# Patient Record
Sex: Female | Born: 1975 | Race: White | Hispanic: No | Smoking: Current every day smoker
Health system: Southern US, Community
[De-identification: ages and names within clinical notes are randomized; demographics above are authoritative.]

## PROBLEM LIST (undated history)

## (undated) DIAGNOSIS — I10 Essential (primary) hypertension: Secondary | ICD-10-CM

## (undated) DIAGNOSIS — F319 Bipolar disorder, unspecified: Secondary | ICD-10-CM

---

## 2001-02-18 ENCOUNTER — Other Ambulatory Visit: Admission: RE | Admit: 2001-02-18 | Discharge: 2001-02-18 | Payer: Self-pay

## 2002-12-15 ENCOUNTER — Other Ambulatory Visit: Admission: RE | Admit: 2002-12-15 | Discharge: 2002-12-15 | Payer: Self-pay

## 2004-06-04 HISTORY — PX: LEEP: SHX91

## 2005-03-15 ENCOUNTER — Other Ambulatory Visit: Admission: RE | Admit: 2005-03-15 | Discharge: 2005-03-15 | Payer: Self-pay | Admitting: Gynecology

## 2007-02-11 ENCOUNTER — Other Ambulatory Visit: Admission: RE | Admit: 2007-02-11 | Discharge: 2007-02-11 | Payer: Self-pay | Admitting: Obstetrics and Gynecology

## 2010-06-25 ENCOUNTER — Encounter: Payer: Self-pay | Admitting: Obstetrics and Gynecology

## 2014-09-02 ENCOUNTER — Emergency Department (HOSPITAL_COMMUNITY): Payer: 59

## 2014-09-02 ENCOUNTER — Encounter (HOSPITAL_COMMUNITY): Payer: Self-pay | Admitting: Emergency Medicine

## 2014-09-02 ENCOUNTER — Emergency Department (HOSPITAL_COMMUNITY)
Admission: EM | Admit: 2014-09-02 | Discharge: 2014-09-02 | Disposition: A | Discharge status: Home / Self Care | Payer: 59 | Attending: Emergency Medicine | Admitting: Emergency Medicine

## 2014-09-02 DIAGNOSIS — R197 Diarrhea, unspecified: Secondary | ICD-10-CM | POA: Insufficient documentation

## 2014-09-02 DIAGNOSIS — F319 Bipolar disorder, unspecified: Secondary | ICD-10-CM | POA: Insufficient documentation

## 2014-09-02 DIAGNOSIS — R110 Nausea: Secondary | ICD-10-CM | POA: Insufficient documentation

## 2014-09-02 DIAGNOSIS — Z79899 Other long term (current) drug therapy: Secondary | ICD-10-CM | POA: Insufficient documentation

## 2014-09-02 DIAGNOSIS — R05 Cough: Secondary | ICD-10-CM | POA: Diagnosis present

## 2014-09-02 DIAGNOSIS — R5383 Other fatigue: Secondary | ICD-10-CM | POA: Insufficient documentation

## 2014-09-02 DIAGNOSIS — J209 Acute bronchitis, unspecified: Secondary | ICD-10-CM | POA: Diagnosis not present

## 2014-09-02 DIAGNOSIS — Z3202 Encounter for pregnancy test, result negative: Secondary | ICD-10-CM | POA: Insufficient documentation

## 2014-09-02 DIAGNOSIS — M791 Myalgia: Secondary | ICD-10-CM | POA: Insufficient documentation

## 2014-09-02 DIAGNOSIS — J40 Bronchitis, not specified as acute or chronic: Secondary | ICD-10-CM

## 2014-09-02 DIAGNOSIS — Z720 Tobacco use: Secondary | ICD-10-CM | POA: Diagnosis not present

## 2014-09-02 DIAGNOSIS — I10 Essential (primary) hypertension: Secondary | ICD-10-CM | POA: Diagnosis not present

## 2014-09-02 HISTORY — DX: Essential (primary) hypertension: I10

## 2014-09-02 HISTORY — DX: Bipolar disorder, unspecified: F31.9

## 2014-09-02 LAB — CBC WITH DIFFERENTIAL/PLATELET
Basophils Absolute: 0 10*3/uL (ref 0.0–0.1)
Basophils Relative: 0 % (ref 0–1)
Eosinophils Absolute: 0 10*3/uL (ref 0.0–0.7)
Eosinophils Relative: 1 % (ref 0–5)
HCT: 35.6 % — ABNORMAL LOW (ref 36.0–46.0)
Hemoglobin: 12.2 g/dL (ref 12.0–15.0)
Lymphocytes Relative: 19 % (ref 12–46)
Lymphs Abs: 1.2 10*3/uL (ref 0.7–4.0)
MCH: 31.1 pg (ref 26.0–34.0)
MCHC: 34.3 g/dL (ref 30.0–36.0)
MCV: 90.8 fL (ref 78.0–100.0)
Monocytes Absolute: 0.4 10*3/uL (ref 0.1–1.0)
Monocytes Relative: 6 % (ref 3–12)
Neutro Abs: 4.7 10*3/uL (ref 1.7–7.7)
Neutrophils Relative %: 74 % (ref 43–77)
Platelets: 187 10*3/uL (ref 150–400)
RBC: 3.92 MIL/uL (ref 3.87–5.11)
RDW: 14.5 % (ref 11.5–15.5)
WBC: 6.3 10*3/uL (ref 4.0–10.5)

## 2014-09-02 LAB — URINALYSIS, ROUTINE W REFLEX MICROSCOPIC
Bilirubin Urine: NEGATIVE
Glucose, UA: NEGATIVE mg/dL
HGB URINE DIPSTICK: NEGATIVE
KETONES UR: NEGATIVE mg/dL
LEUKOCYTES UA: NEGATIVE
Nitrite: NEGATIVE
PROTEIN: NEGATIVE mg/dL
Specific Gravity, Urine: >1.03 — ABNORMAL HIGH (ref 1.005–1.030)
UROBILINOGEN UA: 0.2 mg/dL (ref 0.0–1.0)
pH: 5.5 (ref 5.0–8.0)

## 2014-09-02 LAB — COMPREHENSIVE METABOLIC PANEL
ALT: 29 U/L (ref 0–35)
AST: 40 U/L — ABNORMAL HIGH (ref 0–37)
Albumin: 3.6 g/dL (ref 3.5–5.2)
Alkaline Phosphatase: 107 U/L (ref 39–117)
Anion gap: 11 (ref 5–15)
BUN: 22 mg/dL (ref 6–23)
CO2: 26 mmol/L (ref 19–32)
Calcium: 8.3 mg/dL — ABNORMAL LOW (ref 8.4–10.5)
Chloride: 94 mmol/L — ABNORMAL LOW (ref 96–112)
Creatinine, Ser: 1.09 mg/dL (ref 0.50–1.10)
GFR calc Af Amer: 75 mL/min — ABNORMAL LOW (ref >90)
GFR calc non Af Amer: 64 mL/min — ABNORMAL LOW (ref >90)
Glucose, Bld: 94 mg/dL (ref 70–99)
Potassium: 3.3 mmol/L — ABNORMAL LOW (ref 3.5–5.1)
Sodium: 131 mmol/L — ABNORMAL LOW (ref 135–145)
Total Bilirubin: 0.5 mg/dL (ref 0.3–1.2)
Total Protein: 6.9 g/dL (ref 6.0–8.3)

## 2014-09-02 LAB — LACTIC ACID, PLASMA: Lactic Acid, Venous: 1 mmol/L (ref 0.5–2.0)

## 2014-09-02 LAB — PREGNANCY, URINE: PREG TEST UR: NEGATIVE

## 2014-09-02 MED ORDER — POTASSIUM CHLORIDE CRYS ER 20 MEQ PO TBCR
40.0000 meq | EXTENDED_RELEASE_TABLET | Freq: Once | ORAL | Status: AC
  Administered 2014-09-02: 40 meq via ORAL
  Filled 2014-09-02: qty 2

## 2014-09-02 MED ORDER — ALBUTEROL SULFATE HFA 108 (90 BASE) MCG/ACT IN AERS
2.0000 | INHALATION_SPRAY | RESPIRATORY_TRACT | Status: DC | PRN
  Administered 2014-09-02: 2 via RESPIRATORY_TRACT
  Filled 2014-09-02: qty 6.7

## 2014-09-02 MED ORDER — LEVOFLOXACIN 500 MG PO TABS
500.0000 mg | ORAL_TABLET | Freq: Once | ORAL | Status: AC
  Administered 2014-09-02: 500 mg via ORAL
  Filled 2014-09-02: qty 1

## 2014-09-02 MED ORDER — LEVOFLOXACIN 500 MG PO TABS: 500.0000 mg | ORAL_TABLET | Freq: Every day | ORAL | Status: DC

## 2014-09-02 MED ORDER — PROMETHAZINE HCL 25 MG PO TABS: 25.0000 mg | ORAL_TABLET | Freq: Four times a day (QID) | ORAL | Status: DC | PRN

## 2014-09-02 MED ORDER — ONDANSETRON HCL 4 MG/2ML IJ SOLN
4.0000 mg | Freq: Once | INTRAMUSCULAR | Status: AC
  Administered 2014-09-02: 4 mg via INTRAVENOUS
  Filled 2014-09-02: qty 2

## 2014-09-02 MED ORDER — HYDROCOD POLST-CHLORPHEN POLST 10-8 MG/5ML PO LQCR: 5.0000 mL | Freq: Two times a day (BID) | ORAL | Status: DC | PRN

## 2014-09-02 MED ORDER — SODIUM CHLORIDE 0.9 % IV BOLUS (SEPSIS)
1000.0000 mL | Freq: Once | INTRAVENOUS | Status: AC
  Administered 2014-09-02: 1000 mL via INTRAVENOUS

## 2014-09-02 NOTE — ED Notes (Signed)
Pt given fluids and encouraged to drink as much as possible.

## 2014-09-02 NOTE — ED Notes (Signed)
Patient c/o cough, fever, chills, body aches, diarrhea and vomiting.

## 2014-09-02 NOTE — ED Provider Notes (Signed)
CSN: [id number]     Arrival date & time 09/02/14  [id number] History  This chart was scribed for Bethann BerkshireJoseph Javarious Elsayed, MD by Bronson CurbJacqueline Melvin, ED Scribe. This patient was seen in room APA08/APA08 and the patient's care was started at 8:29 PM.   Chief Complaint  Patient presents with  . Influenza    Patient is a 39 y.o. female presenting with flu symptoms. The history is provided by the patient. No language interpreter was used.  Influenza Presenting symptoms: cough, diarrhea, fatigue, fever, myalgias, nausea and shortness of breath   Presenting symptoms: no headaches and no vomiting   Severity:  Moderate Duration:  5 hours Progression:  Unchanged Chronicity:  New Relieved by:  None tried Worsened by:  Nothing tried Ineffective treatments:  None tried Associated symptoms: chills   Associated symptoms: no congestion     HPI Comments: Sarah Espinoza is a 39 y.o. female who presents to the Emergency Department complaining of persistent flu-like symptoms for the past 5 days. Patient notes subjective fever (triage temp 98.3 F), diaphoresis, "shaking", chill, generalized body aches, intermittent cough, SOB, fatigue, nausea, and diarrhea. Patient states she has been drinking plenty of fluids. Patient is also complaining of shooting left flank pain. She denies history of prior abdominal surgeries. She further denies vomiting.    Past Medical History  Diagnosis Date  . Hypertension   . Bipolar 1 disorder    Past Surgical History  Procedure Laterality Date  . Leep  2006   No family history on file. History  Substance Use Topics  . Smoking status: Current Every Day Smoker  . Smokeless tobacco: Not on file  . Alcohol Use: No   OB History    No data available     Review of Systems  Constitutional: Positive for fever, chills and fatigue. Negative for appetite change.  HENT: Negative for congestion, ear discharge and sinus pressure.   Eyes: Negative for discharge.  Respiratory: Positive for  cough and shortness of breath.   Cardiovascular: Negative for chest pain.  Gastrointestinal: Positive for nausea and diarrhea. Negative for vomiting and abdominal pain.  Genitourinary: Negative for frequency and hematuria.  Musculoskeletal: Positive for myalgias. Negative for back pain.  Skin: Negative for rash.  Neurological: Negative for seizures and headaches.  Psychiatric/Behavioral: Negative for hallucinations.      Allergies  Review of patient's allergies indicates no known allergies.  Home Medications   Prior to Admission medications   Medication Sig Start Date End Date Taking? Authorizing Provider  FLUoxetine (PROZAC) 40 MG capsule Take 40 mg by mouth 2 (two) times daily. 08/26/14  Yes Historical Provider, MD  gabapentin (NEURONTIN) 800 MG tablet Take 800 mg by mouth 2 (two) times daily. 08/28/14  Yes Historical Provider, MD  lisinopril-hydrochlorothiazide (PRINZIDE,ZESTORETIC) 20-25 MG per tablet Take 1 tablet by mouth daily. 08/14/14  Yes Historical Provider, MD  OXcarbazepine (TRILEPTAL) 150 MG tablet Take 150 mg by mouth 2 (two) times daily. 08/14/14  Yes Historical Provider, MD  VYVANSE 40 MG capsule Take 40 mg by mouth daily. 08/04/14  Yes Historical Provider, MD   Triage Vitals: BP 96/48 mmHg  Pulse 85  Temp(Src) 98.3 F (36.8 C) (Oral)  Resp 20  Ht 5\' 6"  (1.676 m)  Wt 216 lb (97.977 kg)  BMI 34.88 kg/m2  SpO2 95%  LMP 08/30/2014  Physical Exam  Constitutional: She is oriented to person, place, and time. She appears well-developed.  HENT:  Head: Normocephalic.  Eyes: Conjunctivae and EOM are normal. No  scleral icterus.  Neck: Neck supple. No thyromegaly present.  Cardiovascular: Normal rate, regular rhythm and normal heart sounds.  Exam reveals no gallop and no friction rub.   No murmur heard. Pulmonary/Chest: Effort normal. No stridor. She has wheezes. She has no rales. She exhibits no tenderness.  Mild wheezing bilaterally  Abdominal: Soft. She exhibits no  distension. There is no tenderness. There is CVA tenderness. There is no rebound.  Minimal left flank tenderness  Musculoskeletal: Normal range of motion. She exhibits no edema.  Lymphadenopathy:    She has no cervical adenopathy.  Neurological: She is oriented to person, place, and time. She exhibits normal muscle tone. Coordination normal.  Skin: No rash noted. No erythema.  Psychiatric: She has a normal mood and affect. Her behavior is normal.  Nursing note and vitals reviewed.   ED Course  Procedures (including critical care time)  DIAGNOSTIC STUDIES: Oxygen Saturation is 95% on room air, adequate by my interpretation.    COORDINATION OF CARE: At 2032 Discussed treatment plan with patient. Patient agrees.   Labs Review Labs Reviewed - No data to display  Imaging Review No results found.   EKG Interpretation None      MDM   Final diagnoses:  None    Bronchitis/pneumonia.  tx with levaquin and follow up with pcp   The chart was scribed for me under my direct supervision.  I personally performed the history, physical, and medical decision making and all procedures in the evaluation of this patient.Bethann Berkshire, MD 09/02/14 2308

## 2014-09-02 NOTE — ED Notes (Signed)
Discharge instructions and prescriptions given and reviewed with patient.

## 2014-09-02 NOTE — Discharge Instructions (Signed)
Drink plenty of fluids.  Follow up with your md next week °

## 2014-09-02 NOTE — ED Notes (Signed)
Pt states her boyfriend has same symptoms as well

## 2014-10-20 ENCOUNTER — Encounter (HOSPITAL_COMMUNITY): Payer: Self-pay | Admitting: *Deleted

## 2014-10-20 ENCOUNTER — Emergency Department (HOSPITAL_COMMUNITY)
Admission: EM | Admit: 2014-10-20 | Discharge: 2014-10-20 | Disposition: A | Discharge status: Home / Self Care | Payer: 59 | Attending: Emergency Medicine | Admitting: Emergency Medicine

## 2014-10-20 ENCOUNTER — Emergency Department (HOSPITAL_COMMUNITY): Payer: 59

## 2014-10-20 DIAGNOSIS — J209 Acute bronchitis, unspecified: Secondary | ICD-10-CM | POA: Insufficient documentation

## 2014-10-20 DIAGNOSIS — Z720 Tobacco use: Secondary | ICD-10-CM | POA: Insufficient documentation

## 2014-10-20 DIAGNOSIS — Z79899 Other long term (current) drug therapy: Secondary | ICD-10-CM | POA: Insufficient documentation

## 2014-10-20 DIAGNOSIS — F319 Bipolar disorder, unspecified: Secondary | ICD-10-CM | POA: Insufficient documentation

## 2014-10-20 DIAGNOSIS — R05 Cough: Secondary | ICD-10-CM | POA: Diagnosis present

## 2014-10-20 DIAGNOSIS — I10 Essential (primary) hypertension: Secondary | ICD-10-CM | POA: Diagnosis not present

## 2014-10-20 DIAGNOSIS — J9801 Acute bronchospasm: Secondary | ICD-10-CM

## 2014-10-20 DIAGNOSIS — J40 Bronchitis, not specified as acute or chronic: Secondary | ICD-10-CM

## 2014-10-20 LAB — COMPREHENSIVE METABOLIC PANEL
ALK PHOS: 118 U/L (ref 38–126)
ALT: 27 U/L (ref 14–54)
AST: 33 U/L (ref 15–41)
Albumin: 4.5 g/dL (ref 3.5–5.0)
Anion gap: 12 (ref 5–15)
BILIRUBIN TOTAL: 0.6 mg/dL (ref 0.3–1.2)
BUN: 12 mg/dL (ref 6–20)
CHLORIDE: 100 mmol/L — AB (ref 101–111)
CO2: 23 mmol/L (ref 22–32)
Calcium: 9.7 mg/dL (ref 8.9–10.3)
Creatinine, Ser: 0.77 mg/dL (ref 0.44–1.00)
GLUCOSE: 89 mg/dL (ref 65–99)
POTASSIUM: 4.1 mmol/L (ref 3.5–5.1)
Sodium: 135 mmol/L (ref 135–145)
Total Protein: 7.8 g/dL (ref 6.5–8.1)

## 2014-10-20 LAB — CBC WITH DIFFERENTIAL/PLATELET
BASOS ABS: 0 10*3/uL (ref 0.0–0.1)
Basophils Relative: 0 % (ref 0–1)
EOS PCT: 0 % (ref 0–5)
Eosinophils Absolute: 0 10*3/uL (ref 0.0–0.7)
HCT: 42.8 % (ref 36.0–46.0)
HEMOGLOBIN: 14.6 g/dL (ref 12.0–15.0)
Lymphocytes Relative: 15 % (ref 12–46)
Lymphs Abs: 1.5 10*3/uL (ref 0.7–4.0)
MCH: 31.6 pg (ref 26.0–34.0)
MCHC: 34.1 g/dL (ref 30.0–36.0)
MCV: 92.6 fL (ref 78.0–100.0)
MONOS PCT: 6 % (ref 3–12)
Monocytes Absolute: 0.5 10*3/uL (ref 0.1–1.0)
Neutro Abs: 7.6 10*3/uL (ref 1.7–7.7)
Neutrophils Relative %: 79 % — ABNORMAL HIGH (ref 43–77)
PLATELETS: 309 10*3/uL (ref 150–400)
RBC: 4.62 MIL/uL (ref 3.87–5.11)
RDW: 13.5 % (ref 11.5–15.5)
WBC: 9.6 10*3/uL (ref 4.0–10.5)

## 2014-10-20 MED ORDER — ALBUTEROL SULFATE (2.5 MG/3ML) 0.083% IN NEBU
2.5000 mg | INHALATION_SOLUTION | Freq: Once | RESPIRATORY_TRACT | Status: AC
  Administered 2014-10-20: 2.5 mg via RESPIRATORY_TRACT
  Filled 2014-10-20: qty 3

## 2014-10-20 MED ORDER — IPRATROPIUM-ALBUTEROL 0.5-2.5 (3) MG/3ML IN SOLN
3.0000 mL | Freq: Once | RESPIRATORY_TRACT | Status: AC
  Administered 2014-10-20: 3 mL via RESPIRATORY_TRACT
  Filled 2014-10-20: qty 3

## 2014-10-20 MED ORDER — AZITHROMYCIN 250 MG PO TABS
500.0000 mg | ORAL_TABLET | Freq: Once | ORAL | Status: AC
  Administered 2014-10-20: 500 mg via ORAL
  Filled 2014-10-20: qty 2

## 2014-10-20 MED ORDER — PREDNISONE 10 MG PO TABS: 20.0000 mg | ORAL_TABLET | Freq: Every day | ORAL | Status: DC

## 2014-10-20 MED ORDER — AZITHROMYCIN 250 MG PO TABS: ORAL_TABLET | ORAL | Status: DC

## 2014-10-20 MED ORDER — SODIUM CHLORIDE 0.9 % IV BOLUS (SEPSIS)
1000.0000 mL | Freq: Once | INTRAVENOUS | Status: AC
  Administered 2014-10-20: 1000 mL via INTRAVENOUS

## 2014-10-20 MED ORDER — ALBUTEROL SULFATE HFA 108 (90 BASE) MCG/ACT IN AERS: 1.0000 | INHALATION_SPRAY | Freq: Four times a day (QID) | RESPIRATORY_TRACT | Status: DC | PRN

## 2014-10-20 MED ORDER — METHYLPREDNISOLONE SODIUM SUCC 125 MG IJ SOLR
125.0000 mg | Freq: Once | INTRAMUSCULAR | Status: AC
  Administered 2014-10-20: 125 mg via INTRAVENOUS
  Filled 2014-10-20: qty 2

## 2014-10-20 NOTE — ED Notes (Signed)
RT at bedside.

## 2014-10-20 NOTE — ED Notes (Signed)
MD at bedside. 

## 2014-10-20 NOTE — Discharge Instructions (Signed)
Follow up next week for recheck with your md  °

## 2014-10-20 NOTE — ED Provider Notes (Signed)
CSN: [id number]     Arrival date & time 10/20/14  1602 History   First MD Initiated Contact with Patient 10/20/14 1634     Chief Complaint  Patient presents with  . Cough     (Consider location/radiation/quality/duration/timing/severity/associated sxs/prior Treatment) Patient is a 39 y.o. female presenting with cough. The history is provided by the patient (the pt complains of a cough and wheezing).  Cough Cough characteristics:  Productive Sputum characteristics:  Green Severity:  Moderate Onset quality:  Sudden Timing:  Constant Progression:  Waxing and waning Chronicity:  New Context: exposure to allergens   Context: not animal exposure   Associated symptoms: wheezing   Associated symptoms: no chest pain, no eye discharge, no headaches and no rash     Past Medical History  Diagnosis Date  . Hypertension   . Bipolar 1 disorder    Past Surgical History  Procedure Laterality Date  . Leep  2006   History reviewed. No pertinent family history. History  Substance Use Topics  . Smoking status: Current Every Day Smoker -- 1.00 packs/day    Types: Cigarettes  . Smokeless tobacco: Not on file  . Alcohol Use: No   OB History    No data available     Review of Systems  Constitutional: Negative for appetite change and fatigue.  HENT: Negative for congestion, ear discharge and sinus pressure.   Eyes: Negative for discharge.  Respiratory: Positive for cough and wheezing.   Cardiovascular: Negative for chest pain.  Gastrointestinal: Negative for abdominal pain and diarrhea.  Genitourinary: Negative for frequency and hematuria.  Musculoskeletal: Negative for back pain.  Skin: Negative for rash.  Neurological: Negative for seizures and headaches.  Psychiatric/Behavioral: Negative for hallucinations.      Allergies  Review of patient's allergies indicates no known allergies.  Home Medications   Prior to Admission medications   Medication Sig Start Date End Date  Taking? Authorizing Provider  doxepin (SINEQUAN) 25 MG capsule Take 25 mg by mouth at bedtime. 10/07/14  Yes Historical Provider, MD  FLUoxetine (PROZAC) 40 MG capsule Take 40 mg by mouth 2 (two) times daily. 08/26/14  Yes Historical Provider, MD  gabapentin (NEURONTIN) 800 MG tablet Take 800 mg by mouth 2 (two) times daily. 08/28/14  Yes Historical Provider, MD  lisinopril-hydrochlorothiazide (PRINZIDE,ZESTORETIC) 20-25 MG per tablet Take 1 tablet by mouth daily. 08/14/14  Yes Historical Provider, MD  montelukast (SINGULAIR) 10 MG tablet Take 10 mg by mouth daily. 10/09/14  Yes Historical Provider, MD  OXcarbazepine (TRILEPTAL) 150 MG tablet Take 150 mg by mouth 2 (two) times daily. 08/14/14  Yes Historical Provider, MD  VYVANSE 40 MG capsule Take 40 mg by mouth daily. 08/04/14  Yes Historical Provider, MD  albuterol (PROVENTIL HFA;VENTOLIN HFA) 108 (90 BASE) MCG/ACT inhaler Inhale 1-2 puffs into the lungs every 6 (six) hours as needed for wheezing or shortness of breath. 10/20/14   Bethann BerkshireJoseph Bernadette Armijo, MD  azithromycin [facility](ZITHROMAX) 250 MG tablet Take one pill a day 10/20/14   Bethann BerkshireJoseph Jasyah Theurer, MD  chlorpheniramine-HYDROcodone [facility](TUSSIONEX PENNKINETIC ER) 10-8 MG/5ML LQCR Take 5 mLs by mouth every 12 (twelve) hours as needed for cough. Patient not taking: Reported on 10/20/2014 09/02/14   Bethann BerkshireJoseph Finnleigh Marchetti, MD  levofloxacin (LEVAQUIN) 500 MG tablet Take 1 tablet (500 mg total) by mouth daily. Patient not taking: Reported on 10/20/2014 09/02/14   Bethann BerkshireJoseph Zedric Deroy, MD  predniSONE (DELTASONE) 10 MG tablet Take 2 tablets (20 mg total) by mouth daily. 10/20/14   Bethann BerkshireJoseph Caprisha Bridgett, MD  promethazine (PHENERGAN) 25 MG tablet Take 1 tablet (25 mg total) by mouth every 6 (six) hours as needed for nausea or vomiting. Patient not taking: Reported on 10/20/2014 09/02/14   Bethann BerkshireJoseph Philip Eckersley, MD   BP 123/54 mmHg  Pulse 86  Temp(Src) 98.1 F (36.7 C) (Oral)  Resp 16  Ht 5\' 6"  (1.676 m)  Wt 209 lb (94.802 kg)  BMI 33.75 kg/m2  SpO2 100%  LMP  10/06/2014 Physical Exam  Constitutional: She is oriented to person, place, and time. She appears well-developed.  HENT:  Head: Normocephalic.  Eyes: Conjunctivae and EOM are normal. No scleral icterus.  Neck: Neck supple. No thyromegaly present.  Cardiovascular: Normal rate and regular rhythm.  Exam reveals no gallop and no friction rub.   No murmur heard. Pulmonary/Chest: No stridor. She has wheezes. She has no rales. She exhibits no tenderness.  Abdominal: She exhibits no distension. There is no tenderness. There is no rebound.  Musculoskeletal: Normal range of motion. She exhibits no edema.  Lymphadenopathy:    She has no cervical adenopathy.  Neurological: She is oriented to person, place, and time. She exhibits normal muscle tone. Coordination normal.  Skin: No rash noted. No erythema.  Psychiatric: She has a normal mood and affect. Her behavior is normal.    ED Course  Procedures (including critical care time) Labs Review Labs Reviewed  CBC WITH DIFFERENTIAL/PLATELET - Abnormal; Notable for the following:    Neutrophils Relative % 79 (*)    All other components within normal limits  COMPREHENSIVE METABOLIC PANEL - Abnormal; Notable for the following:    Chloride 100 (*)    All other components within normal limits    Imaging Review Dg Chest 2 View  10/20/2014   CLINICAL DATA:  Productive cough and congestion  EXAM: CHEST  2 VIEW  COMPARISON:  09/02/2014  FINDINGS: The heart size and mediastinal contours are within normal limits. Both lungs are clear. The visualized skeletal structures are unremarkable.  IMPRESSION: No active cardiopulmonary disease.   Electronically Signed   By: Alcide CleverMark  Lukens M.D.   On: 10/20/2014 17:51     EKG Interpretation None      MDM   Final diagnoses:  Bronchitis  Bronchospasm   Bronchitis and bronchospasm.  tx with pred,  Albut, and zpak with follow up pcp    Bethann BerkshireJoseph Yida Hyams, MD 10/20/14 [phone]

## 2014-10-20 NOTE — ED Notes (Signed)
1 week history of cough, chest congestion, decreased appetite, weakness, inability to sleep, nausea w/out vomiting, no diarrhea. Unknown sick contacts.

## 2016-02-21 ENCOUNTER — Other Ambulatory Visit (HOSPITAL_COMMUNITY): Payer: Self-pay | Admitting: Internal Medicine

## 2016-02-21 DIAGNOSIS — Z1231 Encounter for screening mammogram for malignant neoplasm of breast: Secondary | ICD-10-CM

## 2016-02-29 ENCOUNTER — Ambulatory Visit (HOSPITAL_COMMUNITY): Payer: Self-pay

## 2016-04-10 NOTE — Progress Notes (Deleted)
Psychiatric Initial Adult Assessment   Patient Identification: Sarah Espinoza MRN:  [id number] Date of Evaluation:  04/10/2016 Referral Source: *** Chief Complaint:   Visit Diagnosis: No diagnosis found.  History of Present Illness:   Sarah Espinoza is a 40 year old female   Associated Signs/Symptoms: Depression Symptoms:  {DEPRESSION SYMPTOMS:20000} (Hypo) Manic Symptoms:  {BHH MANIC SYMPTOMS:22872} Anxiety Symptoms:  {BHH ANXIETY SYMPTOMS:22873} Psychotic Symptoms:  {BHH PSYCHOTIC SYMPTOMS:22874} PTSD Symptoms: {BHH PTSD SYMPTOMS:22875}  Past Psychiatric History: ***  Previous Psychotropic Medications: {YES/NO:21197}  Substance Abuse History in the last 12 months:  {yes no:314532}  Consequences of Substance Abuse: {BHH CONSEQUENCES OF SUBSTANCE ABUSE:22880}  Past Medical History:  Past Medical History:  Diagnosis Date  . Bipolar 1 disorder   . Hypertension     Past Surgical History:  Procedure Laterality Date  . LEEP  2006    Family Psychiatric History: ***  Family History: No family history on file.  Social History:   Social History   Social History  . Marital status: Divorced    Spouse name: N/A  . Number of children: N/A  . Years of education: N/A   Social History Main Topics  . Smoking status: Current Every Day Smoker    Packs/day: 1.00    Types: Cigarettes  . Smokeless tobacco: Not on file  . Alcohol use No  . Drug use: No  . Sexual activity: Yes    Birth control/ protection: None   Other Topics Concern  . Not on file   Social History Narrative  . No narrative on file    Additional Social History: ***  Allergies:  No Known Allergies  Metabolic Disorder Labs: No results found for: HGBA1C, MPG No results found for: PROLACTIN No results found for: CHOL, TRIG, HDL, CHOLHDL, VLDL, LDLCALC   Current Medications: Current Outpatient Prescriptions  Medication Sig Dispense Refill  . albuterol (PROVENTIL HFA;VENTOLIN HFA) 108 (90  BASE) MCG/ACT inhaler Inhale 1-2 puffs into the lungs every 6 (six) hours as needed for wheezing or shortness of breath. 1 Inhaler 0  . azithromycin (ZITHROMAX) 250 MG tablet Take one pill a day 4 tablet 0  . chlorpheniramine-HYDROcodone (TUSSIONEX PENNKINETIC ER) 10-8 MG/5ML LQCR Take 5 mLs by mouth every 12 (twelve) hours as needed for cough. (Patient not taking: Reported on 10/20/2014) 60 mL 0  . doxepin (SINEQUAN) 25 MG capsule Take 25 mg by mouth at bedtime.  0  . FLUoxetine (PROZAC) 40 MG capsule Take 40 mg by mouth 2 (two) times daily.  0  . gabapentin (NEURONTIN) 800 MG tablet Take 800 mg by mouth 2 (two) times daily.  0  . levofloxacin (LEVAQUIN) 500 MG tablet Take 1 tablet (500 mg total) by mouth daily. (Patient not taking: Reported on 10/20/2014) 7 tablet 0  . lisinopril-hydrochlorothiazide (PRINZIDE,ZESTORETIC) 20-25 MG per tablet Take 1 tablet by mouth daily.  0  . montelukast (SINGULAIR) 10 MG tablet Take 10 mg by mouth daily.  0  . OXcarbazepine (TRILEPTAL) 150 MG tablet Take 150 mg by mouth 2 (two) times daily.  0  . predniSONE (DELTASONE) 10 MG tablet Take 2 tablets (20 mg total) by mouth daily. 14 tablet 0  . promethazine (PHENERGAN) 25 MG tablet Take 1 tablet (25 mg total) by mouth every 6 (six) hours as needed for nausea or vomiting. (Patient not taking: Reported on 10/20/2014) 15 tablet 0  . VYVANSE 40 MG capsule Take 40 mg by mouth daily.  0   No current facility-administered medications for  this visit.     Neurologic: Headache: {BHH YES OR NO:22294} Seizure: {BHH YES OR NO:22294} Paresthesias:{BHH YES OR XB:28413}O:22294}  Musculoskeletal: Strength & Muscle Tone: {desc; muscle tone:32375} Gait & Station: {PE GAIT ED KGMW:10272}ATL:22525} Patient leans: {Patient Leans:21022755}  Psychiatric Specialty Exam: ROS  There were no vitals taken for this visit.There is no height or weight on file to calculate BMI.  General Appearance: {Appearance:22683}  Eye Contact:  {BHH EYE CONTACT:22684}   Speech:  {Speech:22685}  Volume:  {Volume (PAA):22686}  Mood:  {BHH MOOD:22306}  Affect:  {Affect (PAA):22687}  Thought Process:  {Thought Process (PAA):22688}  Orientation:  {BHH ORIENTATION (PAA):22689}  Thought Content:  {Thought Content:22690}  Suicidal Thoughts:  {ST/HT (PAA):22692}  Homicidal Thoughts:  {ST/HT (PAA):22692}  Memory:  {BHH MEMORY:22881}  Judgement:  {Judgement (PAA):22694}  Insight:  {Insight (PAA):22695}  Psychomotor Activity:  {Psychomotor (PAA):22696}  Concentration:  {Concentration:21399}  Recall:  {BHH GOOD/FAIR/POOR:22877}  Fund of Knowledge:{BHH GOOD/FAIR/POOR:22877}  Language: {BHH GOOD/FAIR/POOR:22877}  Akathisia:  {BHH YES OR NO:22294}  Handed:  {Handed:22697}  AIMS (if indicated):  ***  Assets:  {Assets (PAA):22698}  ADL's:  {BHH ZDG'U:44034}ADL'S:22290}  Cognition: {chl bhh cognition:304700322}  Sleep:  ***    Treatment Plan Summary: {CHL AMB BH MD TX VQQV:[id number]}Plan:236 633 1673}   Neysa Hottereina Skilynn Durney, MD 11/7/201710:14 AM

## 2016-04-12 ENCOUNTER — Encounter (HOSPITAL_COMMUNITY): Payer: Self-pay

## 2016-04-12 ENCOUNTER — Ambulatory Visit (HOSPITAL_COMMUNITY): Payer: Self-pay | Admitting: Psychiatry

## 2016-07-04 ENCOUNTER — Emergency Department (HOSPITAL_COMMUNITY): Payer: Self-pay

## 2016-07-04 ENCOUNTER — Emergency Department (HOSPITAL_COMMUNITY)
Admission: EM | Admit: 2016-07-04 | Discharge: 2016-07-05 | Disposition: A | Discharge status: Home / Self Care | Payer: Self-pay | Attending: Emergency Medicine | Admitting: Emergency Medicine

## 2016-07-04 ENCOUNTER — Encounter (HOSPITAL_COMMUNITY): Payer: Self-pay | Admitting: Emergency Medicine

## 2016-07-04 DIAGNOSIS — R569 Unspecified convulsions: Secondary | ICD-10-CM | POA: Insufficient documentation

## 2016-07-04 DIAGNOSIS — F17210 Nicotine dependence, cigarettes, uncomplicated: Secondary | ICD-10-CM | POA: Insufficient documentation

## 2016-07-04 DIAGNOSIS — Z79899 Other long term (current) drug therapy: Secondary | ICD-10-CM | POA: Insufficient documentation

## 2016-07-04 DIAGNOSIS — R413 Other amnesia: Secondary | ICD-10-CM | POA: Insufficient documentation

## 2016-07-04 DIAGNOSIS — I10 Essential (primary) hypertension: Secondary | ICD-10-CM | POA: Insufficient documentation

## 2016-07-04 LAB — BASIC METABOLIC PANEL
Anion gap: 9 (ref 5–15)
BUN: 6 mg/dL (ref 6–20)
CALCIUM: 9.2 mg/dL (ref 8.9–10.3)
CO2: 26 mmol/L (ref 22–32)
Chloride: 102 mmol/L (ref 101–111)
Creatinine, Ser: 0.72 mg/dL (ref 0.44–1.00)
GFR calc non Af Amer: >60 mL/min (ref >60)
Glucose, Bld: 86 mg/dL (ref 65–99)
Potassium: 3.3 mmol/L — ABNORMAL LOW (ref 3.5–5.1)
Sodium: 137 mmol/L (ref 135–145)

## 2016-07-04 LAB — CBC WITH DIFFERENTIAL/PLATELET
BASOS PCT: 0 %
Basophils Absolute: 0 10*3/uL (ref 0.0–0.1)
EOS ABS: 0.1 10*3/uL (ref 0.0–0.7)
EOS PCT: 1 %
HCT: 38.1 % (ref 36.0–46.0)
Hemoglobin: 13 g/dL (ref 12.0–15.0)
Lymphocytes Relative: 17 %
Lymphs Abs: 2.5 10*3/uL (ref 0.7–4.0)
MCH: 31.6 pg (ref 26.0–34.0)
MCHC: 34.1 g/dL (ref 30.0–36.0)
MCV: 92.5 fL (ref 78.0–100.0)
Monocytes Absolute: 0.8 10*3/uL (ref 0.1–1.0)
Monocytes Relative: 6 %
NEUTROS PCT: 76 %
Neutro Abs: 11.5 10*3/uL — ABNORMAL HIGH (ref 1.7–7.7)
PLATELETS: 313 10*3/uL (ref 150–400)
RBC: 4.12 MIL/uL (ref 3.87–5.11)
RDW: 13.6 % (ref 11.5–15.5)
WBC: 15 10*3/uL — ABNORMAL HIGH (ref 4.0–10.5)

## 2016-07-04 LAB — RAPID URINE DRUG SCREEN, HOSP PERFORMED
Amphetamines: NOT DETECTED
Barbiturates: NOT DETECTED
Benzodiazepines: NOT DETECTED
COCAINE: NOT DETECTED
Opiates: NOT DETECTED
Tetrahydrocannabinol: POSITIVE — AB

## 2016-07-04 MED ORDER — FENTANYL CITRATE (PF) 100 MCG/2ML IJ SOLN
50.0000 ug | Freq: Once | INTRAMUSCULAR | Status: AC
  Administered 2016-07-04: 50 ug via INTRAVENOUS
  Filled 2016-07-04: qty 2

## 2016-07-04 MED ORDER — LORAZEPAM 2 MG/ML IJ SOLN
1.0000 mg | Freq: Once | INTRAMUSCULAR | Status: AC
  Administered 2016-07-04: 1 mg via INTRAVENOUS
  Filled 2016-07-04: qty 1

## 2016-07-04 MED ORDER — SODIUM CHLORIDE 0.9 % IV BOLUS (SEPSIS)
1000.0000 mL | Freq: Once | INTRAVENOUS | Status: AC
  Administered 2016-07-04: 1000 mL via INTRAVENOUS

## 2016-07-04 NOTE — ED Triage Notes (Signed)
Pt reports witnessed full body seizure x2 minutes.  Was sitting on bed and fell off bed and hit head on a lamp.  Pt alert and oriented at this time, no hx of seizures.

## 2016-07-05 MED ORDER — LISDEXAMFETAMINE DIMESYLATE 40 MG PO CAPS: 40.0000 mg | ORAL_CAPSULE | ORAL | 0 refills | Status: DC

## 2016-07-05 MED ORDER — CLONAZEPAM 0.5 MG PO TABS: 0.5000 mg | ORAL_TABLET | Freq: Three times a day (TID) | ORAL | 0 refills | Status: DC | PRN

## 2016-07-05 NOTE — Discharge Instructions (Signed)
Recommend follow-up with neurologist.  Phone number given. No driving until cleared by neurologist. Refill for your Klonopin and Vyvanse for 1 week. Brain scan showed no abnormalities.

## 2016-07-05 NOTE — ED Provider Notes (Signed)
[facility]-EMERGENCY DEPT Provider Note   CSN: [id number] Arrival date & time: 07/04/16  1816     History   Chief Complaint Chief Complaint  Patient presents with  . Seizures    HPI Sarah Espinoza is a 41 y.o. female.  Level V caveat for amnesia to event. Approximately 4 PM today, patient was sitting on her bed. Her boyfriend reports jerking behavior with eyes rolling back in the head and slobbering saliva. This lasted approximately 2 minutes. She was confused and sleepy afterwards. She ran out of her Klonopin about 10 days ago and her Vyvanse about 5 days ago. No street drugs or alcohol. No previous history of seizure. No fever, chills, stiff neck.      Past Medical History:  Diagnosis Date  . Bipolar 1 disorder (HCC)   . Hypertension     There are no active problems to display for this patient.   Past Surgical History:  Procedure Laterality Date  . LEEP  2006    OB History    No data available       Home Medications    Prior to Admission medications   Medication Sig Start Date End Date Taking? Authorizing Provider  albuterol (PROVENTIL HFA;VENTOLIN HFA) 108 (90 BASE) MCG/ACT inhaler Inhale 1-2 puffs into the lungs every 6 (six) hours as needed for wheezing or shortness of breath. 10/20/14  Yes Bethann BerkshireJoseph Zammit, MD  doxepin (SINEQUAN) 25 MG capsule Take 25 mg by mouth at bedtime. 10/07/14  Yes Historical Provider, MD  FLUoxetine (PROZAC) 40 MG capsule Take 40 mg by mouth 2 (two) times daily. 08/26/14  Yes Historical Provider, MD  gabapentin (NEURONTIN) 800 MG tablet Take 800 mg by mouth 2 (two) times daily. 08/28/14  Yes Historical Provider, MD  lisinopril-hydrochlorothiazide (PRINZIDE,ZESTORETIC) 20-25 MG per tablet Take 1 tablet by mouth daily. 08/14/14  Yes Historical Provider, MD  montelukast (SINGULAIR) 10 MG tablet Take 10 mg by mouth daily. 10/09/14  Yes Historical Provider, MD  OXcarbazepine (TRILEPTAL) 150 MG tablet Take 150 mg by mouth 2 (two) times daily. 08/14/14   Yes Historical Provider, MD  propranolol (INNOPRAN XL) 120 MG 24 hr capsule Take 120 mg by mouth at bedtime.   Yes Historical Provider, MD  clonazePAM (KLONOPIN) 0.5 MG tablet Take 1 tablet (0.5 mg total) by mouth 3 (three) times daily as needed for anxiety. 07/05/16   Donnetta HutchingBrian Othel Hoogendoorn, MD  lisdexamfetamine (VYVANSE) 40 MG capsule Take 1 capsule (40 mg total) by mouth every morning. 07/05/16   Donnetta HutchingBrian Brinlynn Gorton, MD    Family History History reviewed. No pertinent family history.  Social History Social History  Substance Use Topics  . Smoking status: Current Every Day Smoker    Packs/day: 1.00    Types: Cigarettes  . Smokeless tobacco: Not on file  . Alcohol use No     Allergies   Patient has no known allergies.   Review of Systems Review of Systems  Reason unable to perform ROS: Amnesia to event.     Physical Exam Updated Vital Signs BP (!) 102/52 (BP Location: Right Arm)   Pulse 78   Temp 98 F (36.7 C) (Oral)   Resp 17   Ht 5\' 6"  (1.676 m)   Wt 190 lb (86.2 kg)   LMP 06/20/2016 (Exact Date)   SpO2 97%   BMI 30.67 kg/m   Physical Exam  Constitutional: She is oriented to person, place, and time. She appears well-developed and well-nourished.  HENT:  Head: Normocephalic and atraumatic.  Eyes: Conjunctivae are normal.  Neck: Neck supple.  Cardiovascular: Normal rate and regular rhythm.   Pulmonary/Chest: Effort normal and breath sounds normal.  Abdominal: Soft. Bowel sounds are normal.  Musculoskeletal: Normal range of motion.  Neurological: She is alert and oriented to person, place, and time.  Skin: Skin is warm and dry.  Psychiatric: She has a normal mood and affect. Her behavior is normal.  Nursing note and vitals reviewed.    ED Treatments / Results  Labs (all labs ordered are listed, but only abnormal results are displayed) Labs Reviewed  CBC WITH DIFFERENTIAL/PLATELET - Abnormal; Notable for the following:       Result Value   WBC 15.0 (*)    Neutro Abs 11.5  (*)    All other components within normal limits  BASIC METABOLIC PANEL - Abnormal; Notable for the following:    Potassium 3.3 (*)    All other components within normal limits  RAPID URINE DRUG SCREEN, HOSP PERFORMED - Abnormal; Notable for the following:    Tetrahydrocannabinol POSITIVE (*)    All other components within normal limits    EKG  EKG Interpretation None       Radiology Ct Head Wo Contrast  Result Date: 07/04/2016 CLINICAL DATA:  41 year old female with acute seizure, fall and head injury today. Left headache. Initial encounter. EXAM: CT HEAD WITHOUT CONTRAST TECHNIQUE: Contiguous axial images were obtained from the base of the skull through the vertex without intravenous contrast. COMPARISON:  None. FINDINGS: Brain: No evidence of infarction, hemorrhage, hydrocephalus, extra-axial collection or mass lesion/mass effect. Vascular: No hyperdense vessel or unexpected calcification. Skull: Normal. Negative for fracture or focal lesion. Sinuses/Orbits: No acute finding. Other: None. IMPRESSION: Unremarkable noncontrast head CT. Electronically Signed   By: Harmon Pier M.D.   On: 07/04/2016 20:16    Procedures Procedures (including critical care time)  Medications Ordered in ED Medications  sodium chloride 0.9 % bolus 1,000 mL (0 mLs Intravenous Stopped 07/04/16 2020)  LORazepam (ATIVAN) injection 1 mg (1 mg Intravenous Given 07/04/16 1945)  fentaNYL (SUBLIMAZE) injection 50 mcg (50 mcg Intravenous Given 07/04/16 2101)     Initial Impression / Assessment and Plan / ED Course  I have reviewed the triage vital signs and the nursing notes.  Pertinent labs & imaging results that were available during my care of the patient were reviewed by me and considered in my medical decision making (see chart for details).     No gross neurological deficits in the emergency department. CT head negative. Drug screen positive for THC. Discussed findings with the patient and her boyfriend.  She'll follow-up with neurology. Instructed her not to drive. I refilled her Klonopin and Vyvanse for 1 week.  Final Clinical Impressions(s) / ED Diagnoses   Final diagnoses:  Seizure (HCC)    New Prescriptions New Prescriptions   CLONAZEPAM (KLONOPIN) 0.5 MG TABLET    Take 1 tablet (0.5 mg total) by mouth 3 (three) times daily as needed for anxiety.   LISDEXAMFETAMINE (VYVANSE) 40 MG CAPSULE    Take 1 capsule (40 mg total) by mouth every morning.     Donnetta Hutching, MD 07/06/16 1414

## 2017-01-13 ENCOUNTER — Emergency Department (HOSPITAL_COMMUNITY)
Admission: EM | Admit: 2017-01-13 | Discharge: 2017-01-13 | Disposition: A | Discharge status: Home / Self Care | Payer: Self-pay | Attending: Emergency Medicine | Admitting: Emergency Medicine

## 2017-01-13 ENCOUNTER — Emergency Department (HOSPITAL_COMMUNITY): Payer: Self-pay

## 2017-01-13 ENCOUNTER — Encounter (HOSPITAL_COMMUNITY): Payer: Self-pay | Admitting: Emergency Medicine

## 2017-01-13 DIAGNOSIS — F17210 Nicotine dependence, cigarettes, uncomplicated: Secondary | ICD-10-CM | POA: Insufficient documentation

## 2017-01-13 DIAGNOSIS — Z79899 Other long term (current) drug therapy: Secondary | ICD-10-CM | POA: Insufficient documentation

## 2017-01-13 DIAGNOSIS — J209 Acute bronchitis, unspecified: Secondary | ICD-10-CM | POA: Insufficient documentation

## 2017-01-13 DIAGNOSIS — I10 Essential (primary) hypertension: Secondary | ICD-10-CM | POA: Insufficient documentation

## 2017-01-13 DIAGNOSIS — J0100 Acute maxillary sinusitis, unspecified: Secondary | ICD-10-CM | POA: Insufficient documentation

## 2017-01-13 MED ORDER — ALBUTEROL SULFATE HFA 108 (90 BASE) MCG/ACT IN AERS: 2.0000 | INHALATION_SPRAY | RESPIRATORY_TRACT | 0 refills | Status: DC | PRN

## 2017-01-13 MED ORDER — LEVOFLOXACIN 500 MG PO TABS: 500.0000 mg | ORAL_TABLET | Freq: Every day | ORAL | 0 refills | Status: DC

## 2017-01-13 MED ORDER — IPRATROPIUM-ALBUTEROL 0.5-2.5 (3) MG/3ML IN SOLN
3.0000 mL | Freq: Once | RESPIRATORY_TRACT | Status: AC
  Administered 2017-01-13: 3 mL via RESPIRATORY_TRACT
  Filled 2017-01-13: qty 3

## 2017-01-13 MED ORDER — AMOXICILLIN 500 MG PO CAPS: 500.0000 mg | ORAL_CAPSULE | Freq: Three times a day (TID) | ORAL | 0 refills | Status: DC

## 2017-01-13 NOTE — ED Triage Notes (Signed)
Pt reports cough,congestion, chronic back pain x1 week. Pt reports " I get bronchitis every year." nad noted.

## 2017-01-13 NOTE — Discharge Instructions (Signed)
Return if any problems.  See your Physicain for recheck  

## 2017-01-13 NOTE — ED Provider Notes (Signed)
[facility]-EMERGENCY DEPT Provider Note   CSN: [id number] Arrival date & time: 01/13/17  0855     History   Chief Complaint Chief Complaint  Patient presents with  . Cough    HPI Sarah Espinoza is a 41 y.o. female.  The history is provided by the patient. No language interpreter was used.  Cough  This is a new problem. The problem occurs constantly. The problem has been gradually worsening. The cough is productive of sputum. There has been no fever. Associated symptoms include shortness of breath. Pertinent negatives include no chest pain. She has tried nothing for the symptoms. She is a smoker. Her past medical history is significant for bronchitis.  Pt reports she has a history of getting bronchitis.  Pt reports she is a smoker.  Pt complains of productive cough.  Pt also has sinus pressure and congestion  Past Medical History:  Diagnosis Date  . Bipolar 1 disorder (HCC)   . Hypertension     There are no active problems to display for this patient.   Past Surgical History:  Procedure Laterality Date  . LEEP  2006    OB History    No data available       Home Medications    Prior to Admission medications   Medication Sig Start Date End Date Taking? Authorizing Provider  albuterol (PROVENTIL HFA;VENTOLIN HFA) 108 (90 BASE) MCG/ACT inhaler Inhale 1-2 puffs into the lungs every 6 (six) hours as needed for wheezing or shortness of breath. 10/20/14   Bethann BerkshireZammit, Joseph, MD  clonazePAM (KLONOPIN) 0.5 MG tablet Take 1 tablet (0.5 mg total) by mouth 3 (three) times daily as needed for anxiety. 07/05/16   Donnetta Hutchingook, Brian, MD  doxepin (SINEQUAN) 25 MG capsule Take 25 mg by mouth at bedtime. 10/07/14   [provider]  FLUoxetine (PROZAC) 40 MG capsule Take 40 mg by mouth 2 (two) times daily. 08/26/14   [provider]  gabapentin (NEURONTIN) 800 MG tablet Take 800 mg by mouth 2 (two) times daily. 08/28/14   [provider]  lisdexamfetamine (VYVANSE) 40 MG capsule  Take 1 capsule (40 mg total) by mouth every morning. 07/05/16   Donnetta Hutchingook, Brian, MD  lisinopril-hydrochlorothiazide (PRINZIDE,ZESTORETIC) 20-25 MG per tablet Take 1 tablet by mouth daily. 08/14/14   [provider]  montelukast (SINGULAIR) 10 MG tablet Take 10 mg by mouth daily. 10/09/14   [provider]  OXcarbazepine (TRILEPTAL) 150 MG tablet Take 150 mg by mouth 2 (two) times daily. 08/14/14   [provider]  propranolol (INNOPRAN XL) 120 MG 24 hr capsule Take 120 mg by mouth at bedtime.    [provider]    Family History History reviewed. No pertinent family history.  Social History Social History  Substance Use Topics  . Smoking status: Current Every Day Smoker    Packs/day: 1.00    Types: Cigarettes  . Smokeless tobacco: Never Used  . Alcohol use No     Allergies   Patient has no known allergies.   Review of Systems Review of Systems  Respiratory: Positive for cough and shortness of breath.   Cardiovascular: Negative for chest pain.  All other systems reviewed and are negative.    Physical Exam Updated Vital Signs BP (!) 118/59 (BP Location: Left Arm)   Pulse 76   Temp 98.7 F (37.1 C) (Oral)   Resp 18   Ht 5\' 5"  (1.651 m)   Wt 86.2 kg (190 lb)   LMP 12/26/2016  SpO2 100%   BMI 31.62 kg/m   Physical Exam  Constitutional: She appears well-developed and well-nourished.  HENT:  Head: Normocephalic.  Right Ear: External ear normal.  Left Ear: External ear normal.  Mouth/Throat: Oropharynx is clear and moist.  Tender maxillary sinuses  Eyes: Pupils are equal, round, and reactive to light.  Neck: Normal range of motion. Neck supple.  Cardiovascular: Normal rate.   Pulmonary/Chest: She has wheezes.  Musculoskeletal: Normal range of motion.  Neurological: She is alert.  Skin: Skin is warm.  Psychiatric: She has a normal mood and affect.  Nursing note and vitals reviewed.    ED Treatments / Results  Labs (all labs  ordered are listed, but only abnormal results are displayed) Labs Reviewed - No data to display  EKG  EKG Interpretation None       Radiology Dg Chest 2 View  Result Date: 01/13/2017 CLINICAL DATA:  Seizure.  Larey Seat and hit her head. EXAM: CHEST  2 VIEW COMPARISON:  10/20/2014. FINDINGS: Normal sized heart. Clear lungs. Mild peribronchial thickening. Minimal thoracic spine degenerative changes. IMPRESSION: No acute abnormality.  Stable mild chronic bronchitic changes. Electronically Signed   By: Beckie Salts M.D.   On: 01/13/2017 10:34    Procedures Procedures (including critical care time)  Medications Ordered in ED Medications  ipratropium-albuterol (DUONEB) 0.5-2.5 (3) MG/3ML nebulizer solution 3 mL (3 mLs Nebulization Given 01/13/17 1010)   Pt advised of changes on chest xray and need to stop smoking.    Initial Impression / Assessment and Plan / ED Course  I have reviewed the triage vital signs and the nursing notes.  Pertinent labs & imaging results that were available during my care of the patient were reviewed by me and considered in my medical decision making (see chart for details).     Meds ordered this encounter  Medications  . ipratropium-albuterol (DUONEB) 0.5-2.5 (3) MG/3ML nebulizer solution 3 mL  . amoxicillin (AMOXIL) 500 MG capsule    Sig: Take 1 capsule (500 mg total) by mouth 3 (three) times daily.    Dispense:  30 capsule    Refill:  0    Order Specific Question:   Supervising Provider    Answer:   MILLER, BRIAN [[id number]]  . albuterol (PROVENTIL HFA;VENTOLIN HFA) 108 (90 Base) MCG/ACT inhaler    Sig: Inhale 2 puffs into the lungs every 4 (four) hours as needed for wheezing or shortness of breath.    Dispense:  1 Inhaler    Refill:  0    Order Specific Question:   Supervising Provider    Answer:   Eber Hong [[id number]]     Final Clinical Impressions(s) / ED Diagnoses   Final diagnoses:  Acute bronchitis, unspecified organism  Acute maxillary  sinusitis, recurrence not specified    New Prescriptions New Prescriptions   ALBUTEROL (PROVENTIL HFA;VENTOLIN HFA) 108 (90 BASE) MCG/ACT INHALER    Inhale 2 puffs into the lungs every 4 (four) hours as needed for wheezing or shortness of breath.   AMOXICILLIN (AMOXIL) 500 MG CAPSULE    Take 1 capsule (500 mg total) by mouth 3 (three) times daily.  An After Visit Summary was printed and given to the patient.    Elson Areas, PA-C 01/13/17 1057    Samuel Jester, DO 01/17/17 [phone]

## 2017-03-12 ENCOUNTER — Telehealth (HOSPITAL_COMMUNITY): Payer: Self-pay | Admitting: Psychiatry

## 2017-11-02 IMAGING — DX DG CHEST 2V
2 series · 2 of 2 positions shown · non-contrast
Comparison: 10/20/2014.

CLINICAL DATA: Seizure.  Fell and hit her head.

EXAM:
CHEST  2 VIEW

[chest pa]
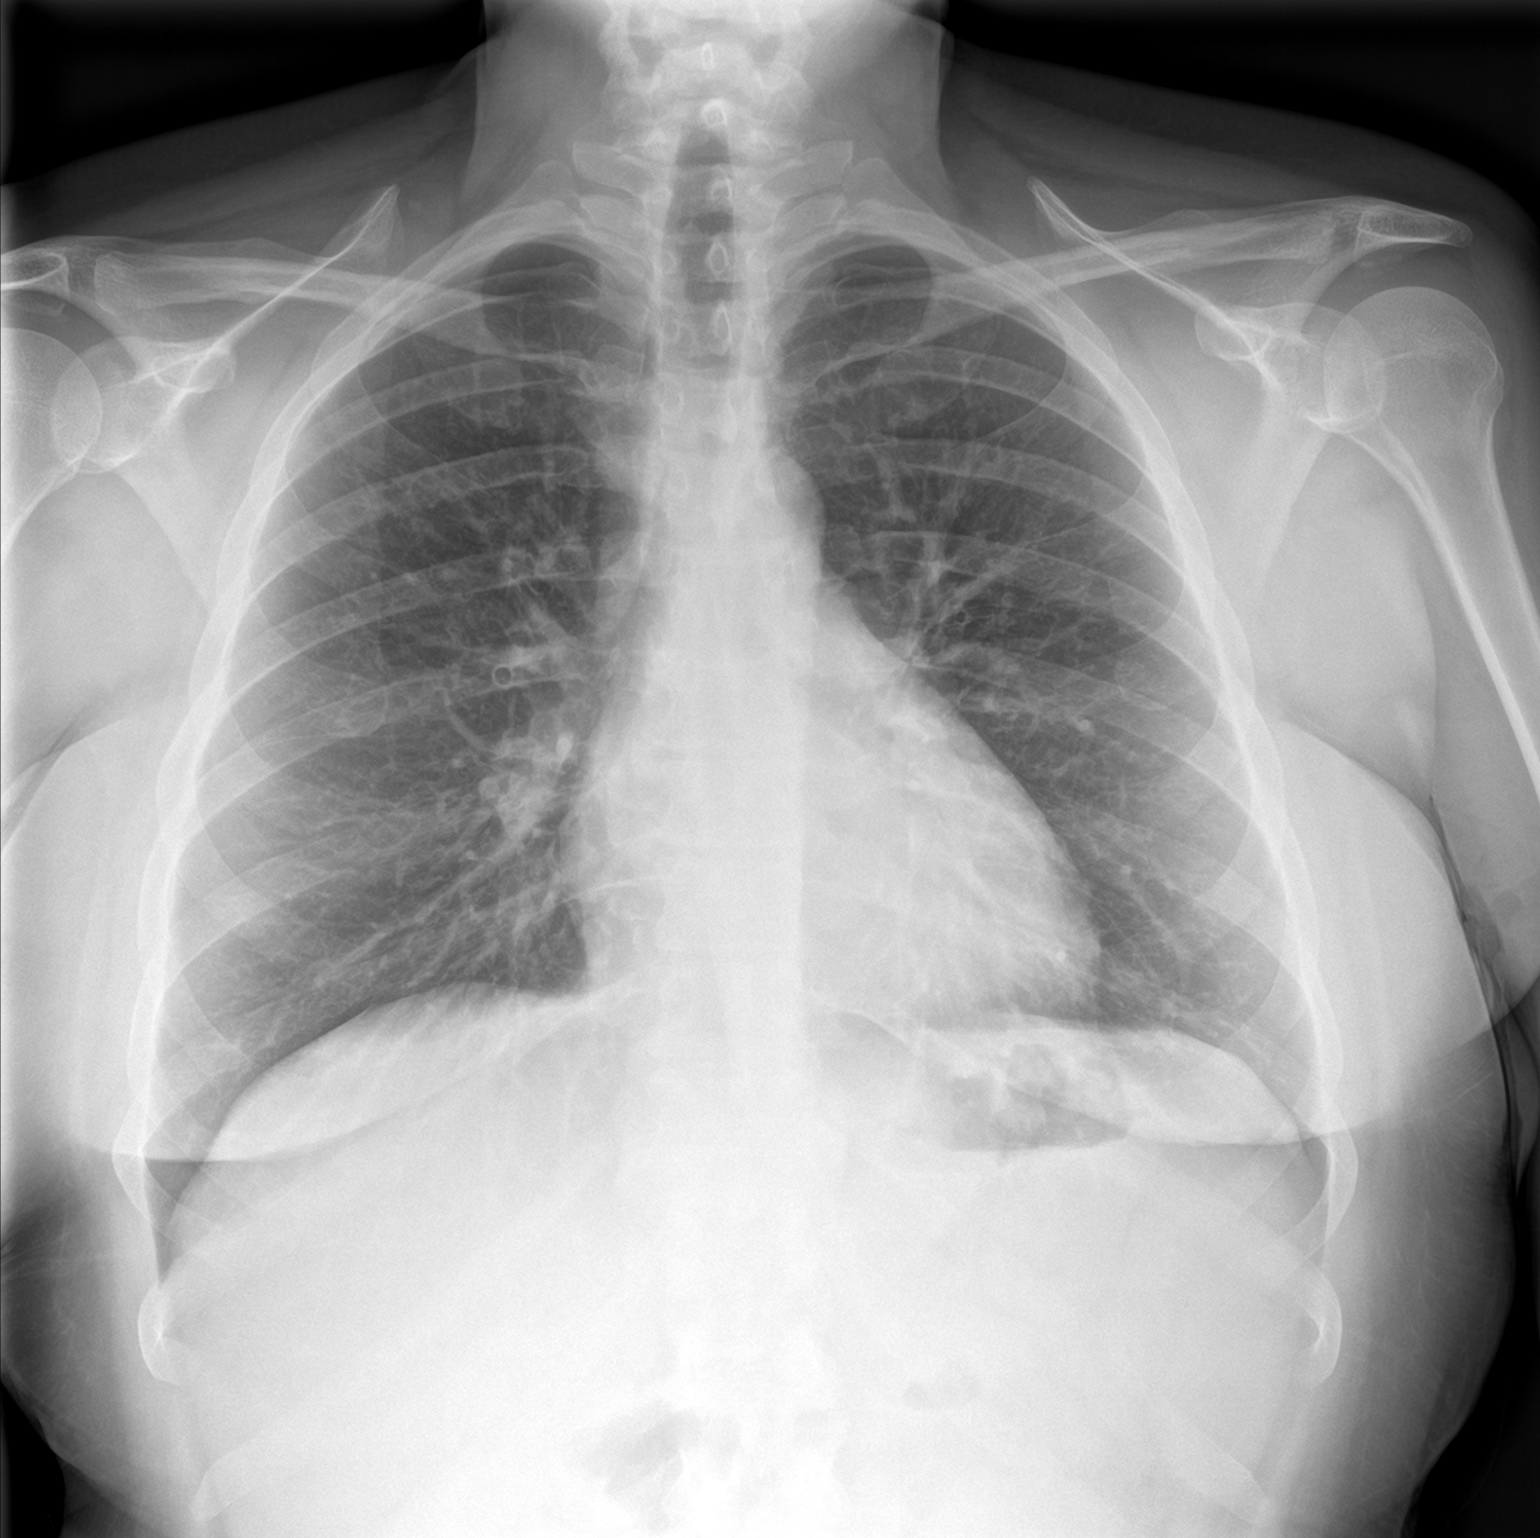

[chest lat]
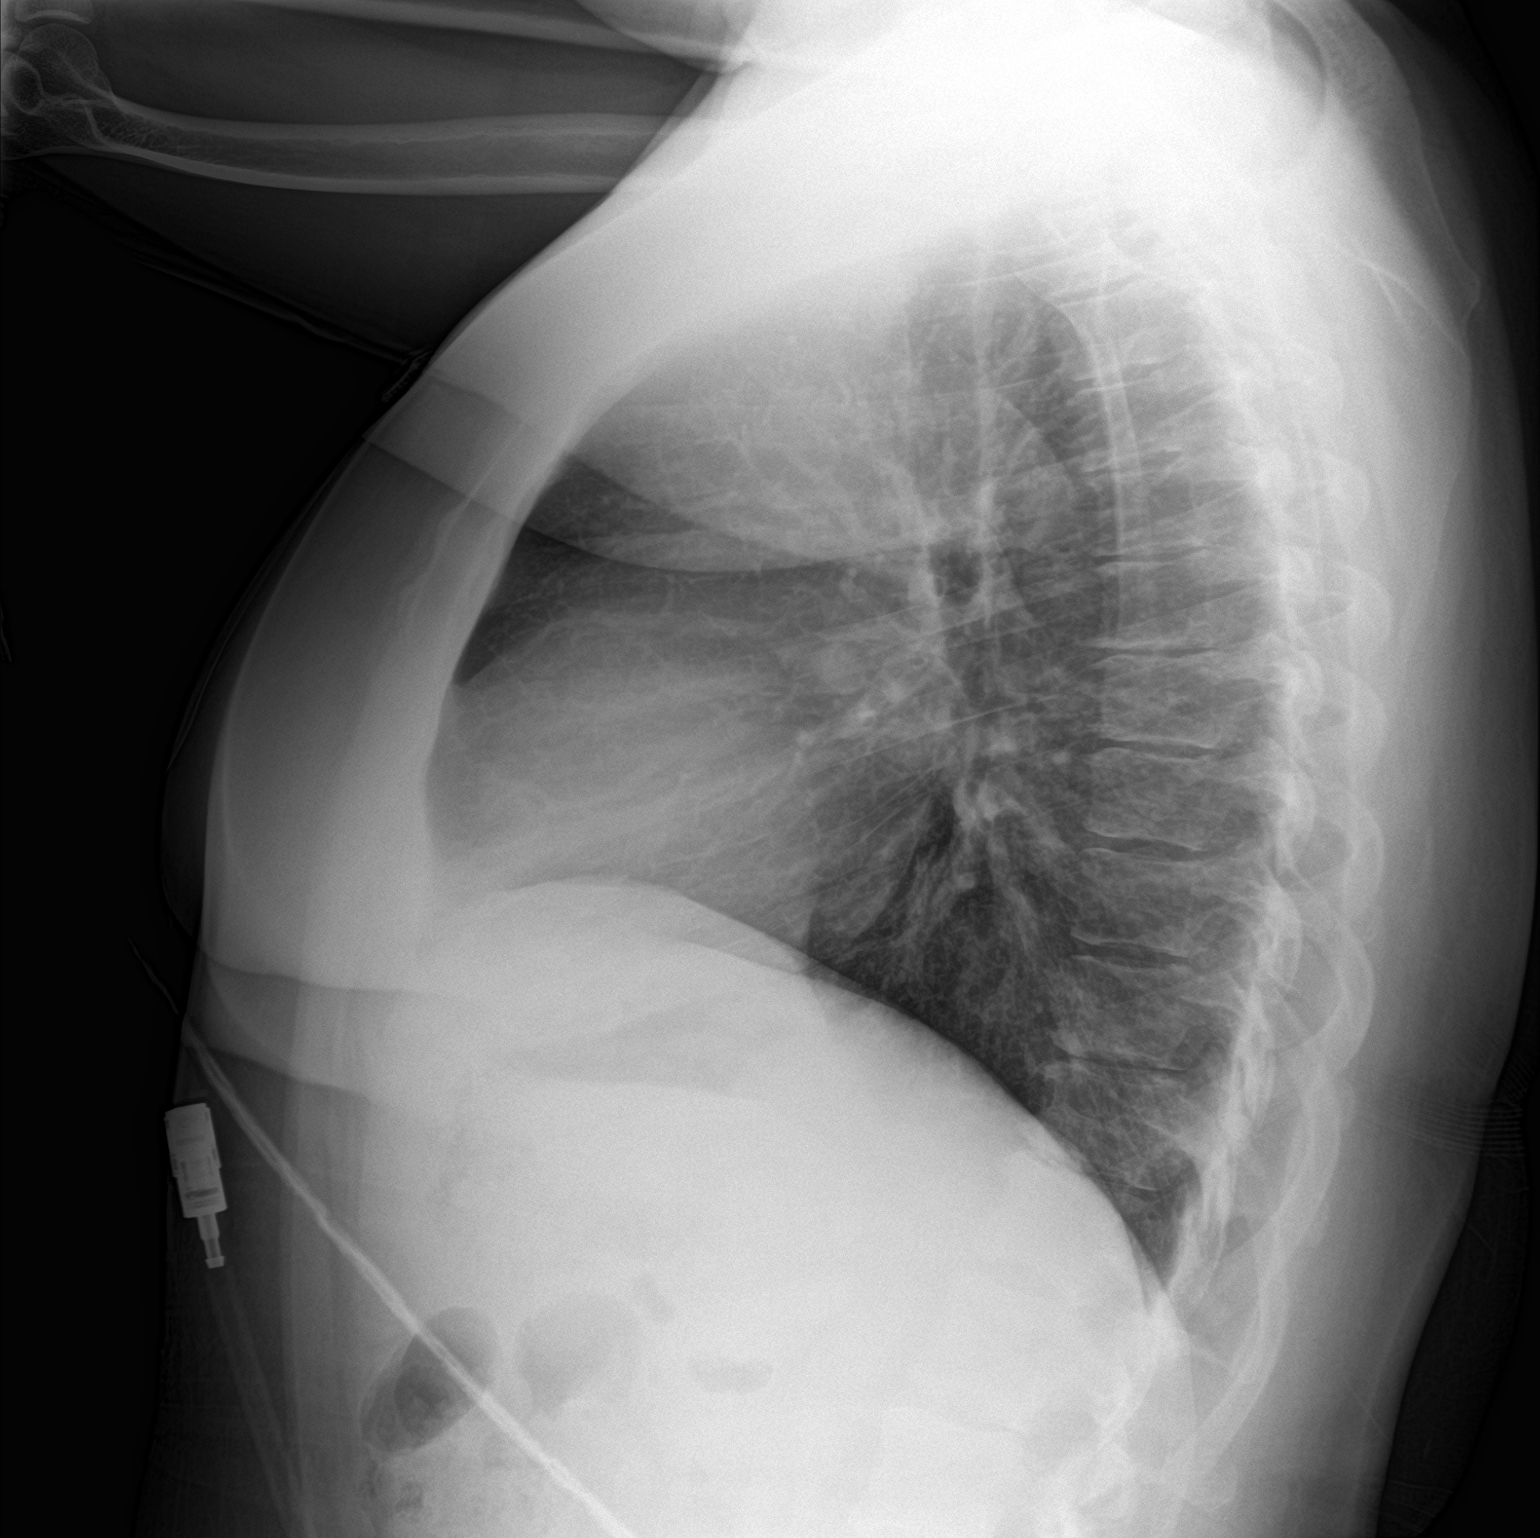

[2 of 2 positions shown; findings below may reference images not displayed]

FINDINGS: Normal sized heart. Clear lungs. Mild peribronchial thickening.
Minimal thoracic spine degenerative changes.
IMPRESSION: No acute abnormality.  Stable mild chronic bronchitic changes.

## 2017-11-20 ENCOUNTER — Ambulatory Visit (HOSPITAL_COMMUNITY)
Admission: RE | Admit: 2017-11-20 | Discharge: 2017-11-20 | Disposition: A | Discharge status: Home / Self Care | Payer: BLUE CROSS/BLUE SHIELD | Source: Ambulatory Visit | Attending: Emergency Medicine | Admitting: Emergency Medicine

## 2017-11-20 ENCOUNTER — Other Ambulatory Visit: Payer: Self-pay

## 2017-11-20 ENCOUNTER — Emergency Department (HOSPITAL_COMMUNITY)
Admission: EM | Admit: 2017-11-20 | Discharge: 2017-11-20 | Disposition: A | Discharge status: Home / Self Care | Payer: BLUE CROSS/BLUE SHIELD | Attending: Emergency Medicine | Admitting: Emergency Medicine

## 2017-11-20 ENCOUNTER — Encounter (HOSPITAL_COMMUNITY): Payer: Self-pay | Admitting: Emergency Medicine

## 2017-11-20 DIAGNOSIS — R1011 Right upper quadrant pain: Secondary | ICD-10-CM | POA: Insufficient documentation

## 2017-11-20 DIAGNOSIS — R110 Nausea: Secondary | ICD-10-CM | POA: Diagnosis present

## 2017-11-20 DIAGNOSIS — I10 Essential (primary) hypertension: Secondary | ICD-10-CM | POA: Insufficient documentation

## 2017-11-20 DIAGNOSIS — Z79899 Other long term (current) drug therapy: Secondary | ICD-10-CM | POA: Diagnosis not present

## 2017-11-20 DIAGNOSIS — F17210 Nicotine dependence, cigarettes, uncomplicated: Secondary | ICD-10-CM | POA: Diagnosis not present

## 2017-11-20 DIAGNOSIS — E871 Hypo-osmolality and hyponatremia: Secondary | ICD-10-CM | POA: Diagnosis not present

## 2017-11-20 LAB — URINALYSIS, ROUTINE W REFLEX MICROSCOPIC
Bilirubin Urine: NEGATIVE
Glucose, UA: NEGATIVE mg/dL
Hgb urine dipstick: NEGATIVE
KETONES UR: NEGATIVE mg/dL
Leukocytes, UA: NEGATIVE
Nitrite: NEGATIVE
PH: 8 (ref 5.0–8.0)
Protein, ur: NEGATIVE mg/dL
Specific Gravity, Urine: 1.008 (ref 1.005–1.030)

## 2017-11-20 LAB — CBC WITH DIFFERENTIAL/PLATELET
BASOS PCT: 0 %
Basophils Absolute: 0 10*3/uL (ref 0.0–0.1)
Eosinophils Absolute: 0.1 10*3/uL (ref 0.0–0.7)
Eosinophils Relative: 2 %
HEMATOCRIT: 37.5 % (ref 36.0–46.0)
HEMOGLOBIN: 12.6 g/dL (ref 12.0–15.0)
LYMPHS PCT: 24 %
Lymphs Abs: 1.6 10*3/uL (ref 0.7–4.0)
MCH: 28.4 pg (ref 26.0–34.0)
MCHC: 33.6 g/dL (ref 30.0–36.0)
MCV: 84.5 fL (ref 78.0–100.0)
MONOS PCT: 9 %
Monocytes Absolute: 0.6 10*3/uL (ref 0.1–1.0)
NEUTROS ABS: 4.5 10*3/uL (ref 1.7–7.7)
NEUTROS PCT: 65 %
Platelets: 311 10*3/uL (ref 150–400)
RBC: 4.44 MIL/uL (ref 3.87–5.11)
RDW: 14.9 % (ref 11.5–15.5)
WBC: 6.9 10*3/uL (ref 4.0–10.5)

## 2017-11-20 LAB — COMPREHENSIVE METABOLIC PANEL
ALBUMIN: 4 g/dL (ref 3.5–5.0)
ALK PHOS: 124 U/L (ref 38–126)
ALT: 31 U/L (ref 14–54)
AST: 28 U/L (ref 15–41)
Anion gap: 9 (ref 5–15)
BILIRUBIN TOTAL: 0.2 mg/dL — AB (ref 0.3–1.2)
BUN: 10 mg/dL (ref 6–20)
CO2: 26 mmol/L (ref 22–32)
Calcium: 9 mg/dL (ref 8.9–10.3)
Chloride: 88 mmol/L — ABNORMAL LOW (ref 101–111)
Creatinine, Ser: 0.62 mg/dL (ref 0.44–1.00)
GFR calc Af Amer: >60 mL/min (ref >60)
GFR calc non Af Amer: >60 mL/min (ref >60)
GLUCOSE: 90 mg/dL (ref 65–99)
Potassium: 3.5 mmol/L (ref 3.5–5.1)
Sodium: 123 mmol/L — ABNORMAL LOW (ref 135–145)
TOTAL PROTEIN: 7.2 g/dL (ref 6.5–8.1)

## 2017-11-20 LAB — LIPASE, BLOOD: Lipase: 59 U/L — ABNORMAL HIGH (ref 11–51)

## 2017-11-20 LAB — PREGNANCY, URINE: Preg Test, Ur: NEGATIVE

## 2017-11-20 MED ORDER — SODIUM CHLORIDE 0.9 % IV BOLUS
1000.0000 mL | Freq: Once | INTRAVENOUS | Status: AC
  Administered 2017-11-20: 1000 mL via INTRAVENOUS

## 2017-11-20 MED ORDER — ONDANSETRON 4 MG PO TBDP: 4.0000 mg | ORAL_TABLET | Freq: Three times a day (TID) | ORAL | 0 refills | Status: DC | PRN

## 2017-11-20 MED ORDER — ONDANSETRON 4 MG PO TBDP
4.0000 mg | ORAL_TABLET | Freq: Once | ORAL | Status: AC
  Administered 2017-11-20: 4 mg via ORAL
  Filled 2017-11-20: qty 1

## 2017-11-20 NOTE — ED Triage Notes (Addendum)
PT C/O nausea and "sharp abdominal pain" that started 2 years ago. Pt states after eating "it makes the pain worse."

## 2017-11-20 NOTE — ED Provider Notes (Signed)
[facility] EMERGENCY DEPARTMENT Provider Note   CSN: [id number] Arrival date & time: 11/20/17  0207     History   Chief Complaint Chief Complaint  Patient presents with  . Nausea    HPI Sarah Espinoza is a 42 y.o. female.  HPI  This is a 42 year old female with history of bipolar disorder and hypertension who presents with abdominal pain and nausea.  Patient reports significant duration of daily nausea and occasional abdominal pain that is worse in the morning.  She reports symptoms have been worse since January.  She has been treated with omeprazole with minimal relief.  She states that she recently had her omeprazole increased by her primary physician.  She has not followed up with GI.  Patient is not currently having any pain but states that she came in this morning because "I cannot deal with it anymore and I did not want to wake up nauseated."  She is unable to tell me whether her symptoms are exacerbated by food.  She states she just feels nauseous all day.  She is not having any pain at this time.  But she reports occasional sharp right upper quadrant pains that radiate to her back.   Past Medical History:  Diagnosis Date  . Bipolar 1 disorder (HCC)   . Hypertension     There are no active problems to display for this patient.   Past Surgical History:  Procedure Laterality Date  . LEEP  2006     OB History   None      Home Medications    Prior to Admission medications   Medication Sig Start Date End Date Taking? Authorizing Provider  albuterol (PROVENTIL HFA;VENTOLIN HFA) 108 (90 Base) MCG/ACT inhaler Inhale 2 puffs into the lungs every 4 (four) hours as needed for wheezing or shortness of breath. 01/13/17   Elson AreasSofia, Leslie K, PA-C  amoxicillin (AMOXIL) 500 MG capsule Take 1 capsule (500 mg total) by mouth 3 (three) times daily. 01/13/17   Elson AreasSofia, Leslie K, PA-C  clonazePAM (KLONOPIN) 0.5 MG tablet Take 1 tablet (0.5 mg total) by mouth 3 (three) times daily as  needed for anxiety. 07/05/16   Donnetta Hutchingook, Brian, MD  doxepin (SINEQUAN) 25 MG capsule Take 25 mg by mouth at bedtime. 10/07/14   [provider]  FLUoxetine (PROZAC) 40 MG capsule Take 40 mg by mouth 2 (two) times daily. 08/26/14   [provider]  gabapentin (NEURONTIN) 800 MG tablet Take 800 mg by mouth 2 (two) times daily. 08/28/14   [provider]  levofloxacin (LEVAQUIN) 500 MG tablet Take 1 tablet (500 mg total) by mouth daily. 01/13/17   Elson AreasSofia, Leslie K, PA-C  lisdexamfetamine (VYVANSE) 40 MG capsule Take 1 capsule (40 mg total) by mouth every morning. 07/05/16   Donnetta Hutchingook, Brian, MD  lisinopril-hydrochlorothiazide (PRINZIDE,ZESTORETIC) 20-25 MG per tablet Take 1 tablet by mouth daily. 08/14/14   [provider]  montelukast (SINGULAIR) 10 MG tablet Take 10 mg by mouth daily. 10/09/14   [provider]  ondansetron (ZOFRAN ODT) 4 MG disintegrating tablet Take 1 tablet (4 mg total) by mouth every 8 (eight) hours as needed for nausea or vomiting. 11/20/17   Jamesrobert Ohanesian, Mayer Maskerourtney F, MD  OXcarbazepine (TRILEPTAL) 150 MG tablet Take 150 mg by mouth 2 (two) times daily. 08/14/14   [provider]  propranolol (INNOPRAN XL) 120 MG 24 hr capsule Take 120 mg by mouth at bedtime.    [provider]    Family History  No family history on file.  Social History Social History   Tobacco Use  . Smoking status: Current Every Day Smoker    Packs/day: 1.00    Types: Cigarettes  . Smokeless tobacco: Never Used  Substance Use Topics  . Alcohol use: No  . Drug use: No     Allergies   Patient has no known allergies.   Review of Systems Review of Systems  Constitutional: Negative for fever.  Respiratory: Negative for shortness of breath.   Cardiovascular: Negative for chest pain.  Gastrointestinal: Positive for abdominal pain and nausea. Negative for diarrhea and vomiting.  Genitourinary: Negative for dysuria.  Neurological: Negative for headaches.  All  other systems reviewed and are negative.    Physical Exam Updated Vital Signs BP 129/74   Pulse 67   Temp 97.7 F (36.5 C) (Oral)   Resp 16   LMP 11/05/2017   SpO2 98%   Physical Exam  Constitutional: She is oriented to person, place, and time. She appears well-developed and well-nourished.  HENT:  Head: Normocephalic and atraumatic.  Eyes: Pupils are equal, round, and reactive to light.  Cardiovascular: Normal rate, regular rhythm and normal heart sounds.  Pulmonary/Chest: Effort normal and breath sounds normal. No respiratory distress. She has no wheezes.  Abdominal: Soft. Bowel sounds are normal. There is no tenderness. There is no rebound and no guarding.  Musculoskeletal: She exhibits no edema.  Neurological: She is alert and oriented to person, place, and time.  Skin: Skin is warm and dry.  Psychiatric: She has a normal mood and affect.  Nursing note and vitals reviewed.    ED Treatments / Results  Labs (all labs ordered are listed, but only abnormal results are displayed) Labs Reviewed  COMPREHENSIVE METABOLIC PANEL - Abnormal; Notable for the following components:      Result Value   Sodium 123 (*)    Chloride 88 (*)    Total Bilirubin 0.2 (*)    All other components within normal limits  LIPASE, BLOOD - Abnormal; Notable for the following components:   Lipase 59 (*)    All other components within normal limits  CBC WITH DIFFERENTIAL/PLATELET  URINALYSIS, ROUTINE W REFLEX MICROSCOPIC  PREGNANCY, URINE    EKG None  Radiology No results found.  Procedures Procedures (including critical care time)  Medications Ordered in ED Medications  ondansetron (ZOFRAN-ODT) disintegrating tablet 4 mg (4 mg Oral Given 11/20/17 0238)  sodium chloride 0.9 % bolus 1,000 mL (0 mLs Intravenous Stopped 11/20/17 0527)     Initial Impression / Assessment and Plan / ED Course  I have reviewed the triage vital signs and the nursing notes.  Pertinent labs & imaging  results that were available during my care of the patient were reviewed by me and considered in my medical decision making (see chart for details).     Patient presents with early morning nausea daily over the last several months.  She also reports intermittent abdominal pain.  Currently not having any abdominal pain and her abdominal exam is benign.  Vital signs are reassuring.  Considerations include gastritis although patient reports refractory to omeprazole, cholecystitis, pancreatitis.  Lab work initiated and largely reassuring with exception of low sodium at 123.  Patient reports decreased p.o. intake.  Patient was given fluids and Zofran.  Lipase is minimally elevated.  I do not feel this truly represents acute pancreatitis given reassuring exam.  No access to ultrasound at this time.  We will have patient return for right  upper quadrant ultrasound imaging to evaluate her gallbladder.  In the meantime recommend fluids and Zofran at home.  Follow-up with primary physician in 1 week for recheck of sodium.  After history, exam, and medical workup I feel the patient has been appropriately medically screened and is safe for discharge home. Pertinent diagnoses were discussed with the patient. Patient was given return precautions.   Final Clinical Impressions(s) / ED Diagnoses   Final diagnoses:  Hyponatremia  Nausea    ED Discharge Orders        Ordered    ondansetron (ZOFRAN ODT) 4 MG disintegrating tablet  Every 8 hours PRN     11/20/17 0527    US Abdomen Limited RUQ/Gall Gladder     11/20/17 0527       Berta Denson, Mayer Masker, MD 11/20/17 [phone]

## 2017-11-20 NOTE — Discharge Instructions (Addendum)
You were seen today for persistent morning nausea.  Your lab work-up is largely reassuring with the exception of low sodium.  Make sure that you are eating and drinking well.  Take Zofran as needed for nausea.  Return for right upper quadrant ultrasound later today.

## 2017-11-20 NOTE — ED Notes (Signed)
Pt ambulatory to waiting room. Pt verbalized understanding of discharge instructions.   

## 2017-11-20 NOTE — ED Notes (Signed)
ED Provider at bedside. 

## 2018-02-02 ENCOUNTER — Encounter (HOSPITAL_COMMUNITY): Payer: Self-pay | Admitting: Emergency Medicine

## 2018-02-02 ENCOUNTER — Other Ambulatory Visit: Payer: Self-pay

## 2018-02-02 ENCOUNTER — Emergency Department (HOSPITAL_COMMUNITY)
Admission: EM | Admit: 2018-02-02 | Discharge: 2018-02-02 | Disposition: A | Discharge status: Home / Self Care | Payer: BLUE CROSS/BLUE SHIELD | Attending: Emergency Medicine | Admitting: Emergency Medicine

## 2018-02-02 DIAGNOSIS — Z79899 Other long term (current) drug therapy: Secondary | ICD-10-CM | POA: Insufficient documentation

## 2018-02-02 DIAGNOSIS — R300 Dysuria: Secondary | ICD-10-CM | POA: Insufficient documentation

## 2018-02-02 DIAGNOSIS — I10 Essential (primary) hypertension: Secondary | ICD-10-CM | POA: Insufficient documentation

## 2018-02-02 DIAGNOSIS — F17210 Nicotine dependence, cigarettes, uncomplicated: Secondary | ICD-10-CM | POA: Insufficient documentation

## 2018-02-02 LAB — URINALYSIS, ROUTINE W REFLEX MICROSCOPIC
Bacteria, UA: NONE SEEN
Bilirubin Urine: NEGATIVE
Glucose, UA: NEGATIVE mg/dL
Hgb urine dipstick: NEGATIVE
KETONES UR: NEGATIVE mg/dL
LEUKOCYTES UA: NEGATIVE
NITRITE: POSITIVE — AB
PH: 7 (ref 5.0–8.0)
Protein, ur: NEGATIVE mg/dL
Specific Gravity, Urine: 1.012 (ref 1.005–1.030)

## 2018-02-02 MED ORDER — IBUPROFEN 800 MG PO TABS
800.0000 mg | ORAL_TABLET | Freq: Once | ORAL | Status: AC
  Administered 2018-02-02: 800 mg via ORAL
  Filled 2018-02-02: qty 1

## 2018-02-02 MED ORDER — CEPHALEXIN 500 MG PO CAPS
500.0000 mg | ORAL_CAPSULE | Freq: Once | ORAL | Status: AC
  Administered 2018-02-02: 500 mg via ORAL
  Filled 2018-02-02: qty 1

## 2018-02-02 MED ORDER — CEPHALEXIN 500 MG PO CAPS: 500.0000 mg | ORAL_CAPSULE | Freq: Four times a day (QID) | ORAL | 0 refills | Status: DC

## 2018-02-02 MED ORDER — ONDANSETRON HCL 4 MG PO TABS
4.0000 mg | ORAL_TABLET | Freq: Once | ORAL | Status: AC
  Administered 2018-02-02: 4 mg via ORAL
  Filled 2018-02-02: qty 1

## 2018-02-02 NOTE — Discharge Instructions (Addendum)
Your vital signs are within normal limits.  There are some mild abnormalities noted on your lung exam.  Please stop smoking.  Please have your lungs rechecked by your primary physician.  Your urine test shows that 1 of the markers for infection called nitrates was positive.  A culture has been sent to the lab.  Please use Keflex with breakfast, lunch, dinner, and at bedtime.  Please have your urine rechecked in 7 to 10 days.

## 2018-02-02 NOTE — ED Triage Notes (Signed)
Patient c/o urgency to urinate with little to void, dysuria, and lower abd pressure. Denies any fevers or vomiting. Patient states symptoms x4 weeks in which she has been taking AZO with no relief.

## 2018-02-02 NOTE — ED Provider Notes (Signed)
[facility] EMERGENCY DEPARTMENT Provider Note   CSN: [id number] Arrival date & time: 02/02/18  1040     History   Chief Complaint Chief Complaint  Patient presents with  . Dysuria    HPI Sarah Espinoza is a 42 y.o. female.  Patient is a 42 year old female who presents to the emergency department because of dysuria.  The patient states that she has been having some urinary symptoms for nearly 4 weeks.  Over the last few days the problem seems to be getting worse.  The patient states that she has a sensation of pressure in the bladder area.  She has urgency, but when she goes to the bathroom she only passes a very small amount of urine.  She has not had fever or chills to be reported.  She has some ongoing chronic back pain, but no new back related issues.  No vomiting.  Patient has been using AZO for assistance with the discomfort, but there is been no relief.  No history of operations or procedures involving the bladder or renal area.  The history is provided by the patient.    Past Medical History:  Diagnosis Date  . Bipolar 1 disorder (HCC)   . Hypertension     There are no active problems to display for this patient.   Past Surgical History:  Procedure Laterality Date  . LEEP  2006     OB History   None      Home Medications    Prior to Admission medications   Medication Sig Start Date End Date Taking? Authorizing Provider  albuterol (PROVENTIL HFA;VENTOLIN HFA) 108 (90 Base) MCG/ACT inhaler Inhale 2 puffs into the lungs every 4 (four) hours as needed for wheezing or shortness of breath. 01/13/17   Elson Areas, PA-C  amoxicillin (AMOXIL) 500 MG capsule Take 1 capsule (500 mg total) by mouth 3 (three) times daily. 01/13/17   Elson Areas, PA-C  clonazePAM (KLONOPIN) 0.5 MG tablet Take 1 tablet (0.5 mg total) by mouth 3 (three) times daily as needed for anxiety. 07/05/16   Donnetta Hutching, MD  doxepin (SINEQUAN) 25 MG capsule Take 25 mg by mouth at bedtime.  10/07/14   [provider]  FLUoxetine (PROZAC) 40 MG capsule Take 40 mg by mouth 2 (two) times daily. 08/26/14   [provider]  gabapentin (NEURONTIN) 800 MG tablet Take 800 mg by mouth 2 (two) times daily. 08/28/14   [provider]  levofloxacin (LEVAQUIN) 500 MG tablet Take 1 tablet (500 mg total) by mouth daily. 01/13/17   Elson Areas, PA-C  lisdexamfetamine (VYVANSE) 40 MG capsule Take 1 capsule (40 mg total) by mouth every morning. 07/05/16   Donnetta Hutching, MD  lisinopril-hydrochlorothiazide (PRINZIDE,ZESTORETIC) 20-25 MG per tablet Take 1 tablet by mouth daily. 08/14/14   [provider]  montelukast (SINGULAIR) 10 MG tablet Take 10 mg by mouth daily. 10/09/14   [provider]  ondansetron (ZOFRAN ODT) 4 MG disintegrating tablet Take 1 tablet (4 mg total) by mouth every 8 (eight) hours as needed for nausea or vomiting. 11/20/17   Horton, Mayer Masker, MD  OXcarbazepine (TRILEPTAL) 150 MG tablet Take 150 mg by mouth 2 (two) times daily. 08/14/14   [provider]  propranolol (INNOPRAN XL) 120 MG 24 hr capsule Take 120 mg by mouth at bedtime.    [provider]    Family History No family history on file.  Social History Social History   Tobacco Use  .  Smoking status: Current Every Day Smoker    Packs/day: 1.00    Types: Cigarettes  . Smokeless tobacco: Never Used  Substance Use Topics  . Alcohol use: No  . Drug use: No     Allergies   Patient has no known allergies.   Review of Systems Review of Systems  Constitutional: Negative for activity change.       All ROS Neg except as noted in HPI  HENT: Negative for nosebleeds.   Eyes: Negative for photophobia and discharge.  Respiratory: Negative for cough, shortness of breath and wheezing.   Cardiovascular: Negative for chest pain and palpitations.  Gastrointestinal: Negative for abdominal pain, blood in stool, nausea and vomiting.  Genitourinary: Positive for  difficulty urinating and dysuria. Negative for frequency and hematuria.  Musculoskeletal: Negative for arthralgias, back pain and neck pain.  Skin: Negative.   Neurological: Negative for dizziness, seizures and speech difficulty.  Psychiatric/Behavioral: Negative for confusion and hallucinations.     Physical Exam Updated Vital Signs BP (!) 149/86 (BP Location: Right Arm)   Pulse 80   Temp 97.7 F (36.5 C) (Oral)   Resp 20   Ht 5\' 6"  (1.676 m)   Wt 95.3 kg   LMP 01/08/2018   SpO2 100%   BMI 33.89 kg/m   Physical Exam  Constitutional: She is oriented to person, place, and time. She appears well-developed and well-nourished.  Non-toxic appearance.  HENT:  Head: Normocephalic.  Right Ear: Tympanic membrane and external ear normal.  Left Ear: Tympanic membrane and external ear normal.  Eyes: Pupils are equal, round, and reactive to light. EOM and lids are normal.  Neck: Normal range of motion. Neck supple. Carotid bruit is not present.  Cardiovascular: Normal rate, regular rhythm, normal heart sounds, intact distal pulses and normal pulses.  Pulmonary/Chest: Breath sounds normal. No respiratory distress.  Course breath sounds and rhonchi present.  Abdominal: Soft. Bowel sounds are normal. There is no tenderness. There is no guarding.  Discomfort to palpation over the suprapubic area, the right and left pubic area.  No mass appreciated.  Musculoskeletal: Normal range of motion.  Lymphadenopathy:       Head (right side): No submandibular adenopathy present.       Head (left side): No submandibular adenopathy present.    She has no cervical adenopathy.  Neurological: She is alert and oriented to person, place, and time. She has normal strength. No cranial nerve deficit or sensory deficit.  Skin: Skin is warm and dry.  Psychiatric: She has a normal mood and affect. Her speech is normal.  Nursing note and vitals reviewed.    ED Treatments / Results  Labs (all labs ordered are  listed, but only abnormal results are displayed) Labs Reviewed  URINALYSIS, ROUTINE W REFLEX MICROSCOPIC - Abnormal; Notable for the following components:      Result Value   Color, Urine AMBER (*)    Nitrite POSITIVE (*)    All other components within normal limits  URINE CULTURE    EKG None  Radiology No results found.  Procedures Procedures (including critical care time)  Medications Ordered in ED Medications - No data to display   Initial Impression / Assessment and Plan / ED Course  I have reviewed the triage vital signs and the nursing notes.  Pertinent labs & imaging results that were available during my care of the patient were reviewed by me and considered in my medical decision making (see chart for details).  Final Clinical Impressions(s) / ED Diagnoses MDM  Vital signs reviewed.  Pulse oximetry 100% during the emergency department visit. Urine analysis shows a clear specimen with specific gravity of 1.012.  The nitrates are positive.  Leukocyte esterase is negative.  There is 0-5 red cells and 0-5 white blood cells on the microscopic.  A culture has been sent to the lab.  Patient denies vaginal discharge at this time, no unusual vaginal bleeding.  I discussed the findings with the patient.  We will start Keflex until cultures can be completed.  Of asked the patient increase fluids.  Patient is already using AZO for discomfort.  The patient will return to the emergency department sooner if any changes in condition, problems, or concerns.   Final diagnoses:  Dysuria    ED Discharge Orders         Ordered    cephALEXin (KEFLEX) 500 MG capsule  4 times daily     02/02/18 1158           Ivery Quale, PA-C 02/03/18 1610    Bethann Berkshire, MD 02/04/18 1027

## 2018-02-02 NOTE — ED Notes (Signed)
Lab notified to add on culture.

## 2018-02-05 LAB — URINE CULTURE: SPECIAL REQUESTS: NORMAL

## 2018-02-06 ENCOUNTER — Telehealth: Payer: Self-pay | Admitting: Emergency Medicine

## 2018-02-06 NOTE — Telephone Encounter (Signed)
Post ED Visit - Positive Culture Follow-up  Culture report reviewed by antimicrobial stewardship pharmacist:  []  Enzo Bi, Pharm.D. []  Celedonio Miyamoto, Pharm.D., BCPS AQ-ID []  Garvin Fila, Pharm.D., BCPS []  Georgina Pillion, Pharm.D., BCPS []  [place], [place].D., BCPS, AAHIVP []  Estella Husk, Pharm.D., BCPS, AAHIVP [x]  Lysle Pearl, PharmD, BCPS []  Phillips Climes, PharmD, BCPS []  Agapito Games, PharmD, BCPS []  Verlan Friends, PharmD  Positive urine culture Treated with cephalexin, organism sensitive to the same and no further patient follow-up is required at this time.  Berle Mull 02/06/2018, 10:53 AM

## 2018-11-24 ENCOUNTER — Other Ambulatory Visit: Payer: Self-pay

## 2018-11-24 ENCOUNTER — Ambulatory Visit
Admission: EM | Admit: 2018-11-24 | Discharge: 2018-11-24 | Disposition: A | Discharge status: Home / Self Care | Payer: PRIVATE HEALTH INSURANCE

## 2018-11-24 DIAGNOSIS — Z760 Encounter for issue of repeat prescription: Secondary | ICD-10-CM

## 2018-11-24 MED ORDER — GABAPENTIN 800 MG PO TABS: 800.0000 mg | ORAL_TABLET | Freq: Four times a day (QID) | ORAL | 1 refills | Status: DC

## 2018-11-24 NOTE — Discharge Instructions (Addendum)
Gabapentin refilled Follow up with PCP for further medication refills in the future.  I have attached a list of local PCPs in the area if you do not have a local PCP to follow up with Return or go to the ED if you have any new or worsening symptoms such as fever, chills, nausea, vomiting, chest pain, shortness of breath, abdominal pain, changes in bowel or bladder habits, etc..Marland Kitchen

## 2018-11-24 NOTE — ED Provider Notes (Signed)
[facility]   [id number] 11/24/18 Arrival Time: 1410  CC: Med refill  SUBJECTIVE:  Sarah Espinoza is a 43 y.o. female hx significant for HTN, and bipolar 1 disorder, who presents for med refill of gabapentin.   Takes gabapentin for chronic back pain.  Has taken this medication for 4 years.  Takes 800mg , 4 times a day.  Denies adverse reaction to gabapentin in the past.  Denies back pain at this moment, but worse with cutting hair.  Patient denies fever, chills, nausea, vomiting, vision changes, chest pain, SOB, abdominal pain, changes in bowel or bladder habits.     ROS: As per HPI.  Past Medical History:  Diagnosis Date  . Bipolar 1 disorder (HCC)   . Hypertension    Past Surgical History:  Procedure Laterality Date  . LEEP  2006   No Known Allergies No current facility-administered medications on file prior to encounter.    Current Outpatient Medications on File Prior to Encounter  Medication Sig Dispense Refill  . traZODone (DESYREL) 50 MG tablet Take 25 mg by mouth at bedtime.    Marland Kitchen. albuterol (PROVENTIL HFA;VENTOLIN HFA) 108 (90 Base) MCG/ACT inhaler Inhale 2 puffs into the lungs every 4 (four) hours as needed for wheezing or shortness of breath. 1 Inhaler 0  . clonazePAM (KLONOPIN) 0.5 MG tablet Take 1 tablet (0.5 mg total) by mouth 3 (three) times daily as needed for anxiety. 20 tablet 0  . FLUoxetine (PROZAC) 40 MG capsule Take 40 mg by mouth 2 (two) times daily.  0  . levofloxacin (LEVAQUIN) 500 MG tablet Take 1 tablet (500 mg total) by mouth daily. 7 tablet 0  . lisdexamfetamine (VYVANSE) 40 MG capsule Take 1 capsule (40 mg total) by mouth every morning. 7 capsule 0  . lisinopril-hydrochlorothiazide (PRINZIDE,ZESTORETIC) 20-25 MG per tablet Take 1 tablet by mouth daily.  0  . montelukast (SINGULAIR) 10 MG tablet Take 10 mg by mouth daily.  0  . ondansetron (ZOFRAN ODT) 4 MG disintegrating tablet Take 1 tablet (4 mg total) by mouth every 8 (eight) hours as  needed for nausea or vomiting. 20 tablet 0  . OXcarbazepine (TRILEPTAL) 150 MG tablet Take 150 mg by mouth 2 (two) times daily.  0  . [DISCONTINUED] doxepin (SINEQUAN) 25 MG capsule Take 25 mg by mouth at bedtime.  0  . [DISCONTINUED] propranolol (INNOPRAN XL) 120 MG 24 hr capsule Take 120 mg by mouth at bedtime.     Social History   Socioeconomic History  . Marital status: Divorced    Spouse name: Not on file  . Number of children: Not on file  . Years of education: Not on file  . Highest education level: Not on file  Occupational History  . Not on file  Social Needs  . Financial resource strain: Not on file  . Food insecurity    Worry: Not on file    Inability: Not on file  . Transportation needs    Medical: Not on file    Non-medical: Not on file  Tobacco Use  . Smoking status: Current Every Day Smoker    Packs/day: 1.00    Types: Cigarettes  . Smokeless tobacco: Never Used  Substance and Sexual Activity  . Alcohol use: No  . Drug use: No  . Sexual activity: Yes    Birth control/protection: None  Lifestyle  . Physical activity    Days per week: Not on file    Minutes per session: Not on file  .  Stress: Not on file  Relationships  . Social Herbalist on phone: Not on file    Gets together: Not on file    Attends religious service: Not on file    Active member of club or organization: Not on file    Attends meetings of clubs or organizations: Not on file    Relationship status: Not on file  . Intimate partner violence    Fear of current or ex partner: Not on file    Emotionally abused: Not on file    Physically abused: Not on file    Forced sexual activity: Not on file  Other Topics Concern  . Not on file  Social History Narrative  . Not on file   History reviewed. No pertinent family history.  OBJECTIVE:  Vitals:   11/24/18 1424  BP: 116/75  Pulse: 82  Resp: 20  Temp: 98.4 F (36.9 C)  SpO2: 98%    General appearance: alert; no distress  Eyes: PERRLA; EOMI; oropharynx clear HENT: normocephalic; atraumatic Neck: supple with FROM Lungs: clear to auscultation bilaterally Heart: regular rate and rhythm.  Radial pulses 2+ symmetrical bilaterally Extremities: no edema; symmetrical with no gross deformities Skin: warm and dry Neurologic: normal gait Psychological: alert and cooperative; normal mood and affect   ASSESSMENT & PLAN:  1. Medication refill     Meds ordered this encounter  Medications  . gabapentin (NEURONTIN) 800 MG tablet    Sig: Take 1 tablet (800 mg total) by mouth QID for 30 days.    Dispense:  120 tablet    Refill:  1    Order Specific Question:   Supervising Provider    Answer:   Raylene Everts [[id number]]   Gabapentin refilled Follow up with PCP for further medication refills in the future.  I have attached a list of local PCPs in the area if you do not have a local PCP to follow up with Return or go to the ED if you have any new or worsening symptoms such as fever, chills, nausea, vomiting, chest pain, shortness of breath, abdominal pain, changes in bowel or bladder habits, etc...  Reviewed expectations re: course of current medical issues. Questions answered. Outlined signs and symptoms indicating need for more acute intervention. Patient verbalized understanding. After Visit Summary given.   Lestine Box, PA-C 11/24/18 1436

## 2018-11-24 NOTE — ED Triage Notes (Signed)
Needs med refill for gabapentin

## 2019-01-05 ENCOUNTER — Ambulatory Visit
Admission: EM | Admit: 2019-01-05 | Discharge: 2019-01-05 | Disposition: A | Discharge status: Home / Self Care | Payer: PRIVATE HEALTH INSURANCE | Attending: Emergency Medicine | Admitting: Emergency Medicine

## 2019-01-05 ENCOUNTER — Other Ambulatory Visit: Payer: Self-pay

## 2019-01-05 DIAGNOSIS — Z760 Encounter for issue of repeat prescription: Secondary | ICD-10-CM | POA: Diagnosis not present

## 2019-01-05 DIAGNOSIS — K219 Gastro-esophageal reflux disease without esophagitis: Secondary | ICD-10-CM

## 2019-01-05 DIAGNOSIS — F411 Generalized anxiety disorder: Secondary | ICD-10-CM

## 2019-01-05 DIAGNOSIS — I10 Essential (primary) hypertension: Secondary | ICD-10-CM

## 2019-01-05 MED ORDER — OMEPRAZOLE 20 MG PO CPDR: 20.0000 mg | DELAYED_RELEASE_CAPSULE | Freq: Two times a day (BID) | ORAL | 2 refills | Status: DC

## 2019-01-05 MED ORDER — LISINOPRIL-HYDROCHLOROTHIAZIDE 20-25 MG PO TABS: 1.0000 | ORAL_TABLET | Freq: Every day | ORAL | 2 refills | Status: DC

## 2019-01-05 NOTE — ED Provider Notes (Signed)
[facility]   [id number] 01/05/19 Arrival Time: 1432  CC: HTN; medication refill  SUBJECTIVE:  Sarah Espinoza is a 43 y.o. female who presents for blood pressure medication refill.  Hx of HTN "years."  Does not check blood pressure at home.  Takes lisinopril/ HCTZ 20-25 mg once daily.  Does not have a PCP.  Denies HA, vision changes, dizziness, lightheadedness, chest pain, shortness of breath, numbness or tingling in extremities, abdominal pain, changes in bowel or bladder habits.    Hx of anxiety and ADD.  Takes vyvanse.  Reports recent stressors with COVID.  Has tried vistaril in the past without relief.    Also requests prescription refill of omeprazole.  Hx os GERD/ acid reflux for years.    ROS: As per HPI.  All other pertinent ROS negative.     Past Medical History:  Diagnosis Date  . Bipolar 1 disorder ([place])   . Hypertension    Past Surgical History:  Procedure Laterality Date  . LEEP  2006   No Known Allergies No current facility-administered medications on file prior to encounter.    Current Outpatient Medications on File Prior to Encounter  Medication Sig Dispense Refill  . traZODone (DESYREL) 50 MG tablet Take 25 mg by mouth at bedtime.    Marland Kitchen albuterol (PROVENTIL HFA;VENTOLIN HFA) 108 (90 Base) MCG/ACT inhaler Inhale 2 puffs into the lungs every 4 (four) hours as needed for wheezing or shortness of breath. 1 Inhaler 0  . clonazePAM (KLONOPIN) 0.5 MG tablet Take 1 tablet (0.5 mg total) by mouth 3 (three) times daily as needed for anxiety. 20 tablet 0  . FLUoxetine (PROZAC) 40 MG capsule Take 40 mg by mouth 2 (two) times daily.  0  . gabapentin (NEURONTIN) 800 MG tablet Take 1 tablet (800 mg total) by mouth QID for 30 days. 120 tablet 1  . lisdexamfetamine (VYVANSE) 40 MG capsule Take 1 capsule (40 mg total) by mouth every morning. 7 capsule 0  . montelukast (SINGULAIR) 10 MG tablet Take 10 mg by mouth daily.  0  . ondansetron (ZOFRAN ODT) 4 MG  disintegrating tablet Take 1 tablet (4 mg total) by mouth every 8 (eight) hours as needed for nausea or vomiting. 20 tablet 0  . OXcarbazepine (TRILEPTAL) 150 MG tablet Take 150 mg by mouth 2 (two) times daily.  0  . [DISCONTINUED] doxepin (SINEQUAN) 25 MG capsule Take 25 mg by mouth at bedtime.  0  . [DISCONTINUED] propranolol (INNOPRAN XL) 120 MG 24 hr capsule Take 120 mg by mouth at bedtime.     Social History   Socioeconomic History  . Marital status: Divorced    Spouse name: Not on file  . Number of children: Not on file  . Years of education: Not on file  . Highest education level: Not on file  Occupational History  . Not on file  Social Needs  . Financial resource strain: Not on file  . Food insecurity    Worry: Not on file    Inability: Not on file  . Transportation needs    Medical: Not on file    Non-medical: Not on file  Tobacco Use  . Smoking status: Current Every Day Smoker    Packs/day: 1.00    Types: Cigarettes  . Smokeless tobacco: Never Used  Substance and Sexual Activity  . Alcohol use: No  . Drug use: No  . Sexual activity: Yes    Birth control/protection: None  Lifestyle  . Physical activity  Days per week: Not on file    Minutes per session: Not on file  . Stress: Not on file  Relationships  . Social Musicianconnections    Talks on phone: Not on file    Gets together: Not on file    Attends religious service: Not on file    Active member of club or organization: Not on file    Attends meetings of clubs or organizations: Not on file    Relationship status: Not on file  . Intimate partner violence    Fear of current or ex partner: Not on file    Emotionally abused: Not on file    Physically abused: Not on file    Forced sexual activity: Not on file  Other Topics Concern  . Not on file  Social History Narrative  . Not on file   History reviewed. No pertinent family history.  OBJECTIVE:  Vitals:   01/05/19 1448  BP: (!) 164/92  Pulse: 96   Resp: (!) 22  Temp: 98.3 F (36.8 C)  SpO2: 97%    General appearance: alert; no distress Eyes: PERRLA; EOMI HENT: normocephalic; atraumatic Neck: supple with FROM Lungs: clear to auscultation bilaterally Heart: regular rate and rhythm.  Radial pulses 2+ symmetrical bilaterally Extremities: no edema; symmetrical with no gross deformities Skin: warm and dry Neurologic: ambulates without difficulty Psychological: alert and cooperative; normal mood and affect  ASSESSMENT & PLAN:  1. Medication refill   2. Essential hypertension   3. Anxiety state   4. Gastroesophageal reflux disease, esophagitis presence not specified     Meds ordered this encounter  Medications  . lisinopril-hydrochlorothiazide (ZESTORETIC) 20-25 MG tablet    Sig: Take 1 tablet by mouth daily.    Dispense:  30 tablet    Refill:  2    Order Specific Question:   Supervising Provider    Answer:   Eustace MooreNELSON, YVONNE SUE [[id number]]  . omeprazole (PRILOSEC) 20 MG capsule    Sig: Take 1 capsule (20 mg total) by mouth 2 (two) times daily before a meal.    Dispense:  60 capsule    Refill:  2    Order Specific Question:   Supervising Provider    Answer:   Eustace MooreELSON, YVONNE SUE [[id number]]   Blood pressure medication refilled Please monitor blood pressure at home and keep a log Eat a well balanced diet of fruits, vegetables and lean meats.  Avoid foods high in fat and salt Drink water.  At least half your body weight in ounces Exercise for at least 30 minutes daily Follow up with PCP for further evaluation and management of high blood pressure.  [facility] Return or go to the ED if you have any new or worsening symptoms such as vision changes, fatigue, dizziness, chest pain, shortness of breath, nausea, swelling in your hands or feet, urinary symptoms, etc...  Rest and drink fluids Eat a well-balanced diet, and avoid excessive caffeine intake Some things you may try  doing to help alleviate your symptoms include: keeping a journal, exercise, talking to a friend or relative, listening to music, going for a walk or hike outside, or other activities that you may find enjoyable Recommending further evaluation and management with PCP Return or go to ER if you have any new or worsening symptoms such as fever, chills, fatigue, worsening shortness of breath, wheezing, chest pain, nausea, vomiting, abdominal pain, changes in bowel or bladder habits, etc...   Omeprazole refilled  for GERD.   Reviewed expectations re: course of current medical issues. Questions answered. Outlined signs and symptoms indicating need for more acute intervention. Patient verbalized understanding. After Visit Summary given.   Rennis HardingWurst, Cassiel Fernandez, PA-C 01/05/19 1608

## 2019-01-05 NOTE — Discharge Instructions (Signed)
Blood pressure medication refilled Please monitor blood pressure at home and keep a log Eat a well balanced diet of fruits, vegetables and lean meats.  Avoid foods high in fat and salt Drink water.  At least half your body weight in ounces Exercise for at least 30 minutes daily Follow up with PCP for further evaluation and management of high blood pressure Return or go to the ED if you have any new or worsening symptoms such as vision changes, fatigue, dizziness, chest pain, shortness of breath, nausea, swelling in your hands or feet, urinary symptoms, etc...  Rest and drink fluids Eat a well-balanced diet, and avoid excessive caffeine intake Some things you may try doing to help alleviate your symptoms include: keeping a journal, exercise, talking to a friend or relative, listening to music, going for a walk or hike outside, or other activities that you may find enjoyable Recommending further evaluation and management with PCP Return or go to ER if you have any new or worsening symptoms such as fever, chills, fatigue, worsening shortness of breath, wheezing, chest pain, nausea, vomiting, abdominal pain, changes in bowel or bladder habits, etc...   Omeprazole refilled for GERD.

## 2019-01-05 NOTE — ED Triage Notes (Signed)
Pt states that she ran out out of BP meds having difficult time getting in with pcp

## 2019-01-20 ENCOUNTER — Ambulatory Visit: Payer: PRIVATE HEALTH INSURANCE | Admitting: Family Medicine

## 2019-01-21 ENCOUNTER — Other Ambulatory Visit: Payer: Self-pay

## 2019-01-21 ENCOUNTER — Ambulatory Visit (INDEPENDENT_AMBULATORY_CARE_PROVIDER_SITE_OTHER): Payer: PRIVATE HEALTH INSURANCE | Admitting: Family Medicine

## 2019-01-21 VITALS — BP 106/74 | HR 99 | Temp 99.7°F | Ht 66.0 in | Wt 205.2 lb

## 2019-01-21 DIAGNOSIS — I10 Essential (primary) hypertension: Secondary | ICD-10-CM

## 2019-01-21 NOTE — Progress Notes (Signed)
New Patient Office Visit  Subjective:  Patient ID: Sarah StagerCatherine E Bord, female    DOB: May 18, 1976  Age: 43 y.o. MRN: [id number]  CC:  Chief Complaint  Patient presents with  . New Patient (Initial Visit)  . Anxiety    HPI Sarah Espinoza presents for psy medications for the treatment of bipolar, ADHD and depression  Past Medical History:  Diagnosis Date  . Bipolar 1 disorder (HCC)   . Hypertension     Past Surgical History:  Procedure Laterality Date  . LEEP  2006    No family history on file.  Social History   Socioeconomic History  . Marital status: Divorced    Spouse name: Not on file  . Number of children: Not on file  . Years of education: Not on file  . Highest education level: Not on file  Occupational History  . Not on file  Social Needs  . Financial resource strain: Not on file  . Food insecurity    Worry: Not on file    Inability: Not on file  . Transportation needs    Medical: Not on file    Non-medical: Not on file  Tobacco Use  . Smoking status: Current Every Day Smoker    Packs/day: 1.00    Types: Cigarettes  . Smokeless tobacco: Never Used  Substance and Sexual Activity  . Alcohol use: No  . Drug use: No  . Sexual activity: Yes    Birth control/protection: None  Lifestyle  . Physical activity    Days per week: Not on file    Minutes per session: Not on file  . Stress: Not on file  Relationships  . Social Musicianconnections    Talks on phone: Not on file    Gets together: Not on file    Attends religious service: Not on file    Active member of club or organization: Not on file    Attends meetings of clubs or organizations: Not on file    Relationship status: Not on file  . Intimate partner violence    Fear of current or ex partner: Not on file    Emotionally abused: Not on file    Physically abused: Not on file    Forced sexual activity: Not on file  Other Topics Concern  . Not on file  Social History Narrative  . Not on file     ROS Review of Systems  Objective:   Today's Vitals: BP 106/74 (BP Location: Left Arm, Patient Position: Sitting, Cuff Size: Normal)   Pulse 99   Temp 99.7 F (37.6 C) (Oral)   Ht 5\' 6"  (1.676 m)   Wt 205 lb 3.2 oz (93.1 kg)   LMP 01/05/2019   SpO2 96%   BMI 33.12 kg/m   Physical Exam  Assessment & Plan:    Outpatient Encounter Medications as of 01/21/2019  Medication Sig  . FLUoxetine (PROZAC) 40 MG capsule Take 40 mg by mouth 2 (two) times daily.  Marland Kitchen. gabapentin (NEURONTIN) 800 MG tablet Take 1 tablet (800 mg total) by mouth QID for 30 days.  Marland Kitchen. lisinopril-hydrochlorothiazide (ZESTORETIC) 20-25 MG tablet Take 1 tablet by mouth daily.  . montelukast (SINGULAIR) 10 MG tablet Take 10 mg by mouth daily.  Marland Kitchen. omeprazole (PRILOSEC) 20 MG capsule Take 1 capsule (20 mg total) by mouth 2 (two) times daily before a meal.  . OXcarbazepine (TRILEPTAL) 150 MG tablet Take 150 mg by mouth 2 (two) times daily.  . traZODone (DESYREL) 50  MG tablet Take 25 mg by mouth at bedtime.  . [DISCONTINUED] albuterol (PROVENTIL HFA;VENTOLIN HFA) 108 (90 Base) MCG/ACT inhaler Inhale 2 puffs into the lungs every 4 (four) hours as needed for wheezing or shortness of breath. (Patient not taking: Reported on 01/21/2019)  . [DISCONTINUED] clonazePAM (KLONOPIN) 0.5 MG tablet Take 1 tablet (0.5 mg total) by mouth 3 (three) times daily as needed for anxiety. (Patient not taking: Reported on 01/21/2019)  . [DISCONTINUED] doxepin (SINEQUAN) 25 MG capsule Take 25 mg by mouth at bedtime.  . [DISCONTINUED] lisdexamfetamine (VYVANSE) 40 MG capsule Take 1 capsule (40 mg total) by mouth every morning. (Patient not taking: Reported on 01/21/2019)  . [DISCONTINUED] ondansetron (ZOFRAN ODT) 4 MG disintegrating tablet Take 1 tablet (4 mg total) by mouth every 8 (eight) hours as needed for nausea or vomiting. (Patient not taking: Reported on 01/21/2019)  . [DISCONTINUED] propranolol (INNOPRAN XL) 120 MG 24 hr capsule Take 120 mg by  mouth at bedtime.   No facility-administered encounter medications on file as of 01/21/2019.     Follow-up: No follow-ups on file.  The patient left when I explained to her I would not be able to treat her psy disorders-recommended psy referral-pt declined and became agitated demanding medication and treatment. Pt will not be accepted as a patient at [facility] and will not be charged for the encounter  Hannah Beat, MD

## 2019-05-20 IMAGING — US US ABDOMEN LIMITED
1 series · 14 of 25 positions shown · non-contrast
Comparison: None.

CLINICAL DATA: Abdominal pain with nausea with symptoms worsening
since June 2017.

EXAM:
ULTRASOUND ABDOMEN LIMITED RIGHT UPPER QUADRANT

[Series 1: us abdomen limited · 0.19mm/px · 14 of 68 slices shown]
[im 1/68]
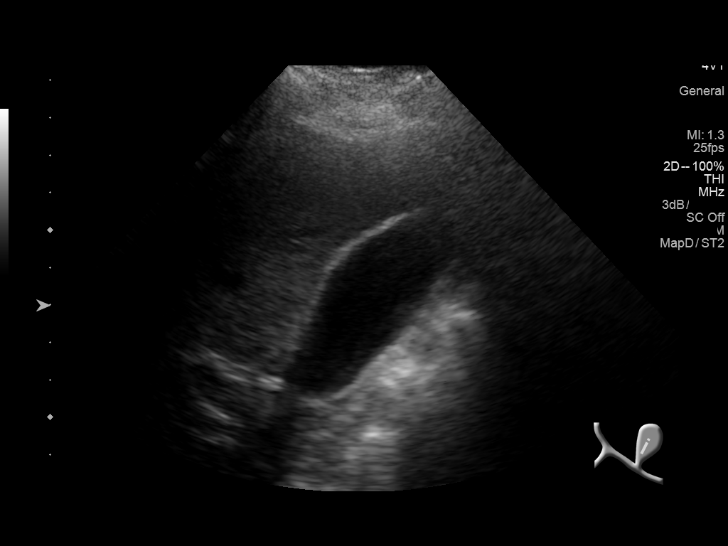
[im 6/68]
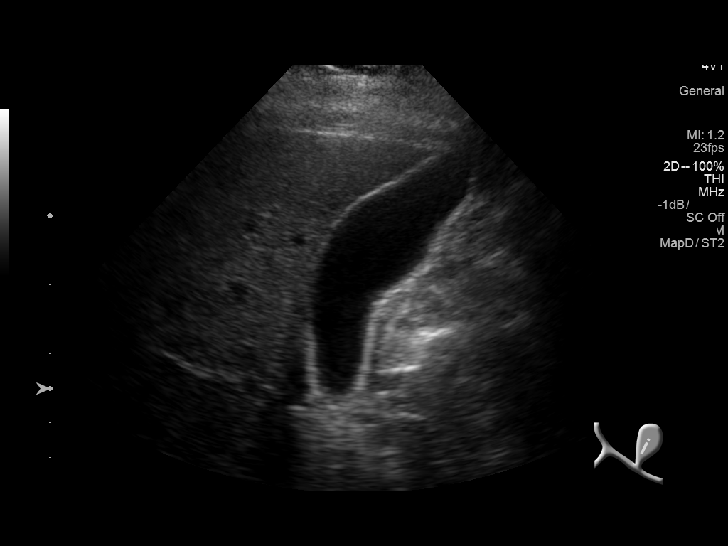
[im 12/68]
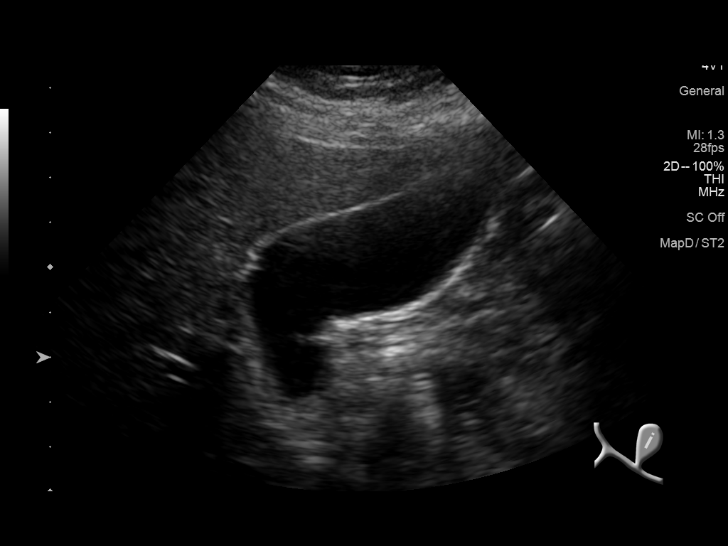
[im 17/68]
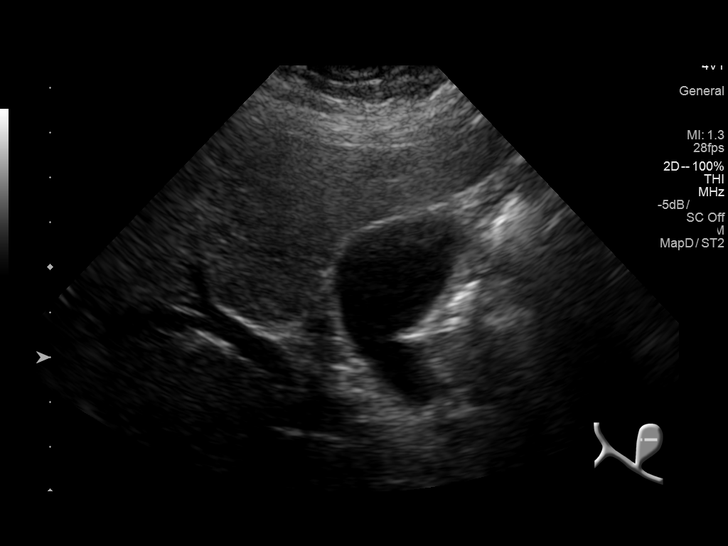
[im 23/68]
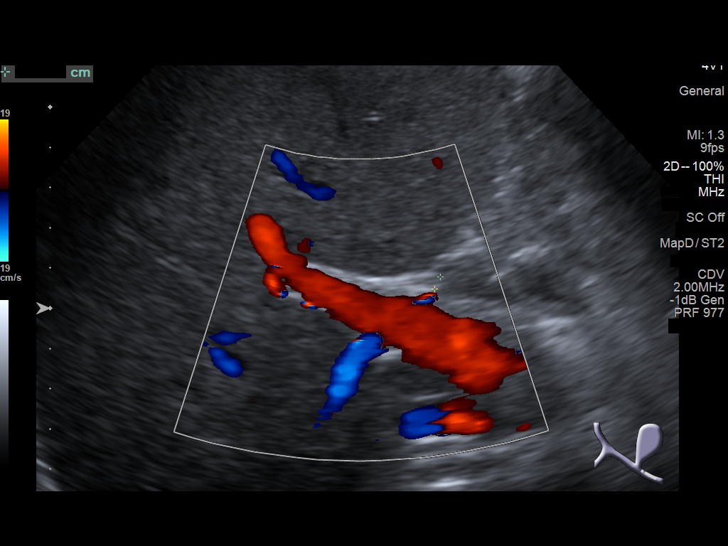
[im 26/68]
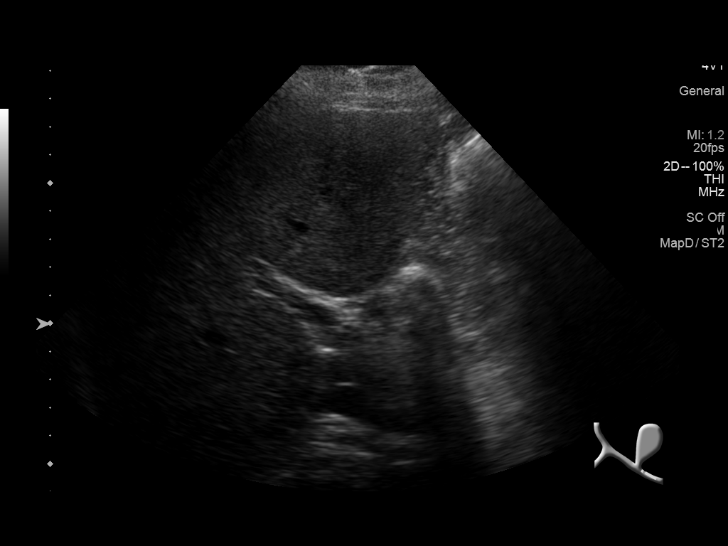
[im 31/68]
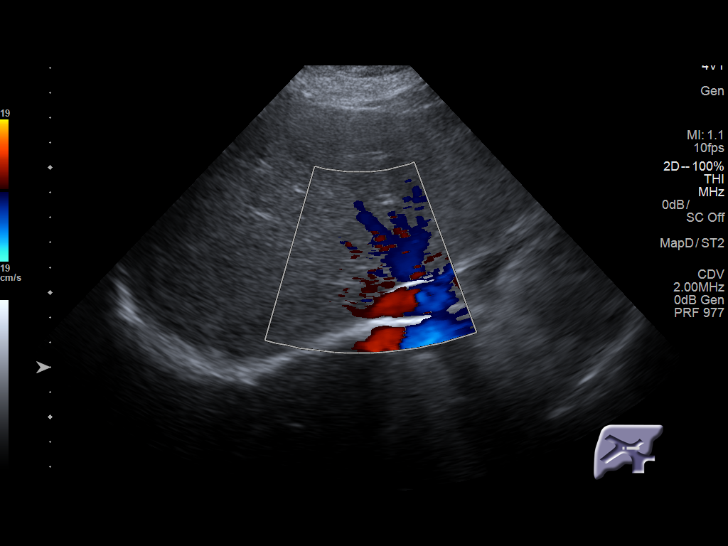
[im 37/68]
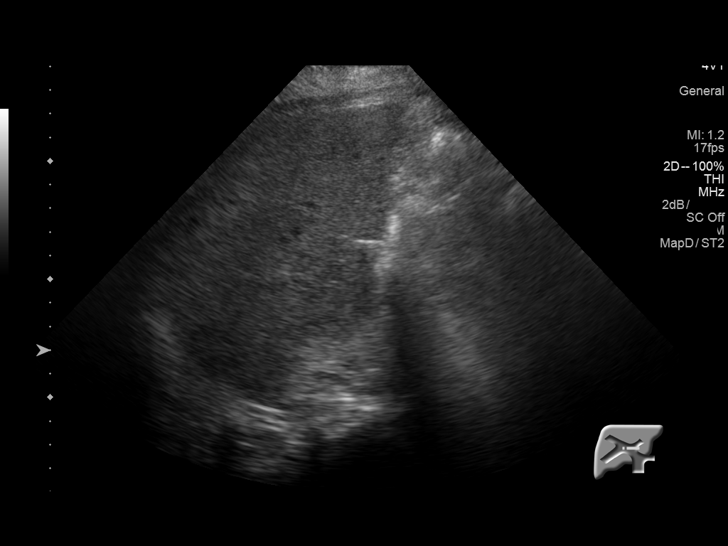
[im 42/68]
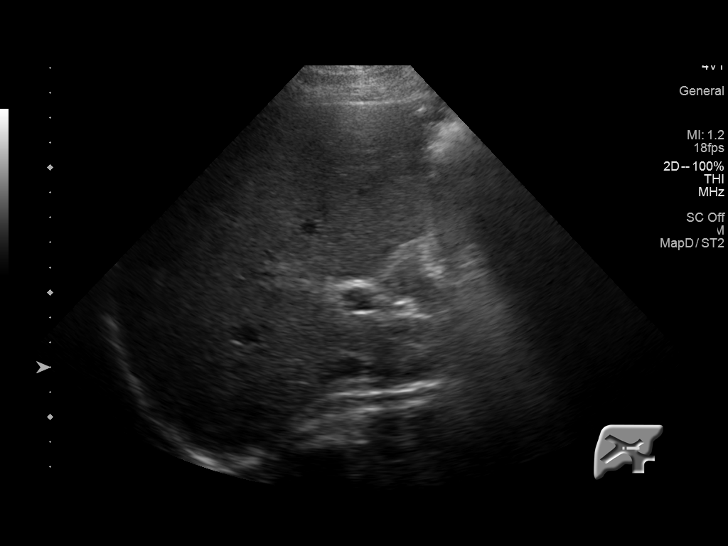
[im 45/68]
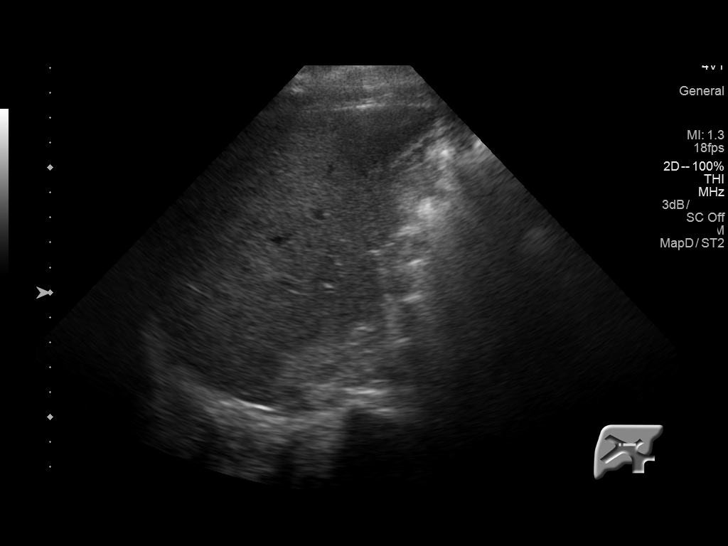
[im 51/68]
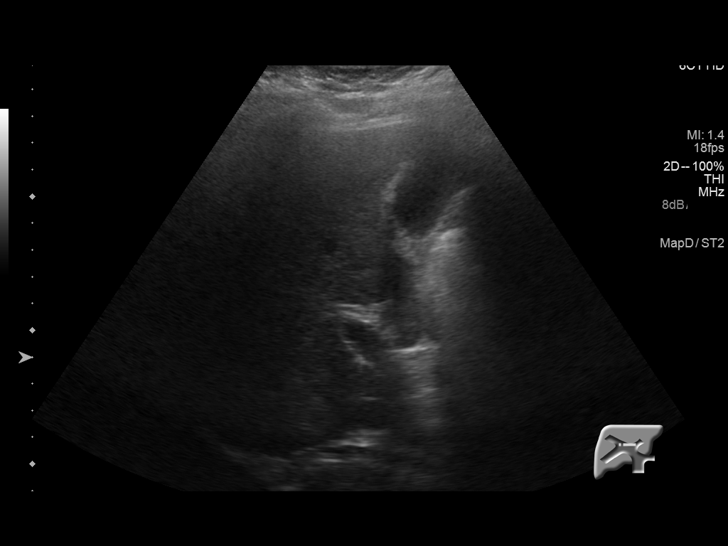
[im 56/68]
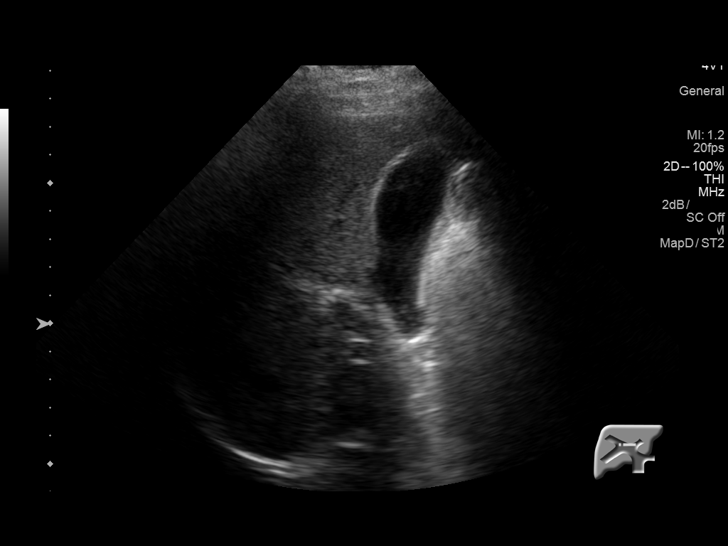
[im 62/68]
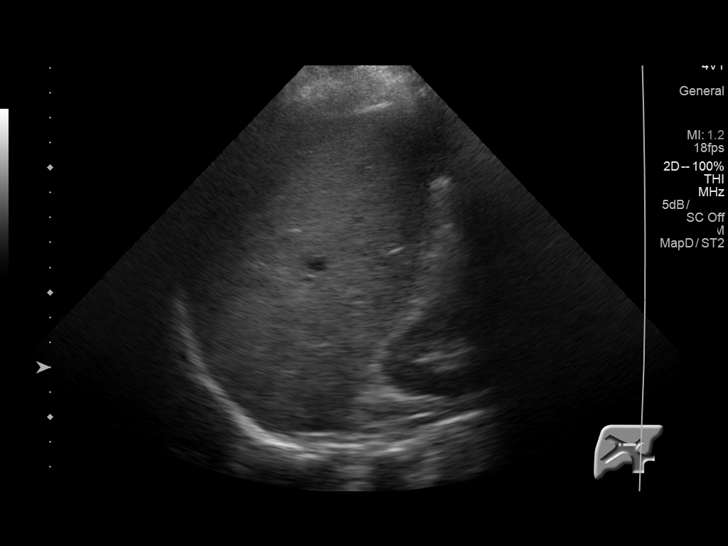
[im 68/68]
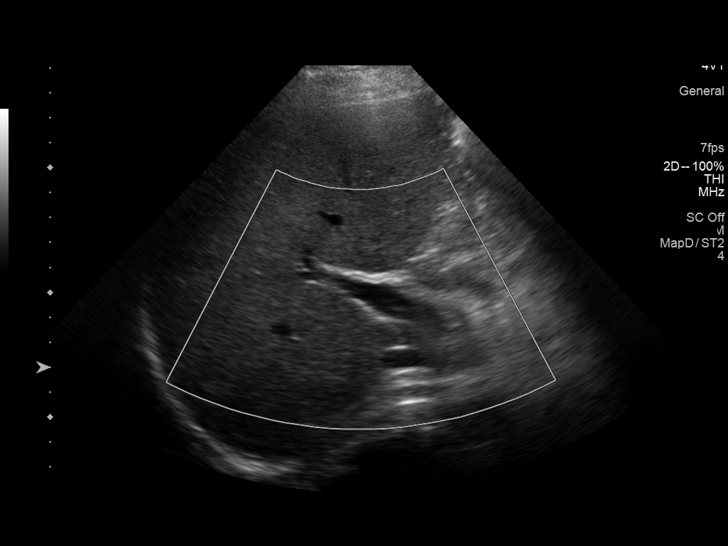

[14 of 25 positions shown; findings below may reference images not displayed]

FINDINGS: Gallbladder:

No gallstones or wall thickening visualized. No sonographic Murphy
sign noted by sonographer.

Common bile duct:

Diameter: 3.3 mm

Liver:

The hepatic echotexture is mildly increased diffusely. The surface
contour is smooth. There is no focal mass nor ductal dilation.
Portal vein is patent on color Doppler imaging with normal direction
of blood flow towards the liver.
IMPRESSION: Normal appearing gallbladder. If there are clinical concerns of
chronic gallbladder dysfunction, a nuclear medicine hepatobiliary
scan with gallbladder ejection fraction determination may be useful.

Increased hepatic echotexture most compatible with fatty
infiltrative change.

## 2019-07-06 ENCOUNTER — Other Ambulatory Visit: Payer: Self-pay

## 2019-07-06 ENCOUNTER — Ambulatory Visit
Admission: EM | Admit: 2019-07-06 | Discharge: 2019-07-06 | Disposition: A | Discharge status: Home / Self Care | Payer: PRIVATE HEALTH INSURANCE | Attending: Family Medicine | Admitting: Family Medicine

## 2019-07-06 DIAGNOSIS — M791 Myalgia, unspecified site: Secondary | ICD-10-CM

## 2019-07-06 DIAGNOSIS — R05 Cough: Secondary | ICD-10-CM | POA: Diagnosis not present

## 2019-07-06 DIAGNOSIS — R0602 Shortness of breath: Secondary | ICD-10-CM | POA: Diagnosis not present

## 2019-07-06 DIAGNOSIS — G4700 Insomnia, unspecified: Secondary | ICD-10-CM

## 2019-07-06 DIAGNOSIS — R059 Cough, unspecified: Secondary | ICD-10-CM

## 2019-07-06 LAB — POC SARS CORONAVIRUS 2 AG -  ED: SARS Coronavirus 2 Ag: NEGATIVE

## 2019-07-06 LAB — POCT INFLUENZA A/B
Influenza A, POC: NEGATIVE
Influenza B, POC: NEGATIVE

## 2019-07-06 MED ORDER — TRAZODONE HCL 50 MG PO TABS: 50.0000 mg | ORAL_TABLET | Freq: Every day | ORAL | 0 refills | Status: DC

## 2019-07-06 NOTE — ED Triage Notes (Signed)
Pt presents to UC w/ c/o chills, cough, sob x2 days.

## 2019-07-06 NOTE — Discharge Instructions (Addendum)
Your COVID test is pending.  You should self quarantine until your test result is back and is negative.    Take Tylenol as needed for fever or discomfort.  Rest and keep yourself hydrated.   You need to follow up with your primary care provider to get refills of your daily maintenance medications.   Go to the emergency department if you develop high fever, shortness of breath, severe diarrhea, or other concerning symptoms.

## 2019-07-06 NOTE — ED Provider Notes (Signed)
[facility]    CSN: [id number] Arrival date & time: 07/06/19  1249      History   Chief Complaint Chief Complaint  Patient presents with  . sob, cough, chills    HPI Sarah Espinoza is a 44 y.o. female.   Reports that she has been having chills, body aches, headaches, cough, shortness of breath for the last 2 days. Denies fever, rash, n/v/d. Reports that she is also out of her trazodone and asking for a refill. Reports that she has not slept in 2 days. Denies sick contacts. Reports that she had a virtual visit with someone, cannot recall and that they started her on tamiflu and zofran. She has been taking these for one day.   The history is provided by the patient.    Past Medical History:  Diagnosis Date  . Bipolar 1 disorder ([place])   . Hypertension     There are no problems to display for this patient.   Past Surgical History:  Procedure Laterality Date  . LEEP  2006    OB History   No obstetric history on file.      Home Medications    Prior to Admission medications   Medication Sig Start Date End Date Taking? Authorizing Provider  FLUoxetine (PROZAC) 40 MG capsule Take 40 mg by mouth 2 (two) times daily. 08/26/14   [provider]  gabapentin (NEURONTIN) 800 MG tablet Take 1 tablet (800 mg total) by mouth QID for 30 days. 11/24/18 12/24/18  Wurst, Marye Round, PA-C  lisinopril-hydrochlorothiazide (ZESTORETIC) 20-25 MG tablet Take 1 tablet by mouth daily. 01/05/19   Wurst, [place], PA-C  montelukast (SINGULAIR) 10 MG tablet Take 10 mg by mouth daily. 10/09/14   [provider]  omeprazole (PRILOSEC) 20 MG capsule Take 1 capsule (20 mg total) by mouth 2 (two) times daily before a meal. 01/05/19 02/04/19  Wurst, [place], PA-C  OXcarbazepine (TRILEPTAL) 150 MG tablet Take 150 mg by mouth 2 (two) times daily. 08/14/14   [provider]  traZODone (DESYREL) 50 MG tablet Take 1 tablet (50 mg total) by mouth at bedtime. 07/06/19   Faustino Congress, NP  doxepin (SINEQUAN) 25 MG capsule Take 25 mg by mouth at bedtime. 10/07/14 11/24/18  [provider]  propranolol (INNOPRAN XL) 120 MG 24 hr capsule Take 120 mg by mouth at bedtime.  11/24/18  [provider]    Family History Family History  Problem Relation Age of Onset  . Healthy Mother   . Healthy Father     Social History Social History   Tobacco Use  . Smoking status: Current Every Day Smoker    Packs/day: 1.00    Types: Cigarettes  . Smokeless tobacco: Never Used  Substance Use Topics  . Alcohol use: No  . Drug use: No     Allergies   Patient has no known allergies.   Review of Systems Review of Systems  Constitutional: Positive for chills and fatigue. Negative for fever.  HENT: Positive for ear pain. Negative for sinus pressure, sinus pain, sneezing and sore throat.   Eyes: Negative for pain and visual disturbance.  Respiratory: Positive for cough. Negative for shortness of breath.   Cardiovascular: Negative for chest pain and palpitations.  Gastrointestinal: Positive for nausea. Negative for abdominal pain, diarrhea and vomiting.  Genitourinary: Negative for dysuria and hematuria.  Musculoskeletal: Positive for myalgias. Negative for arthralgias and back pain.  Skin: Negative for color change and rash.  Neurological: Positive for  headaches. Negative for seizures and syncope.  Psychiatric/Behavioral: Positive for sleep disturbance.  All other systems reviewed and are negative.    Physical Exam Triage Vital Signs ED Triage Vitals  Enc Vitals Group     BP      Pulse      Resp      Temp      Temp src      SpO2      Weight      Height      Head Circumference      Peak Flow      Pain Score      Pain Loc      Pain Edu?      Excl. in GC?    No data found.  Updated Vital Signs BP (!) 147/83 (BP Location: Right Arm) Comment: pt has not had bp meds  Pulse 95   Temp 98.4 F (36.9 C) (Oral)   Resp 17   LMP 06/05/2019  (Approximate)   SpO2 98%      Physical Exam Vitals and nursing note reviewed.  Constitutional:      General: She is not in acute distress.    Appearance: She is well-developed. She is ill-appearing.  HENT:     Head: Normocephalic and atraumatic.     Right Ear: Tympanic membrane normal.     Left Ear: Tympanic membrane normal.     Nose: Nose normal.     Mouth/Throat:     Mouth: Mucous membranes are moist.     Pharynx: Posterior oropharyngeal erythema present.  Eyes:     Conjunctiva/sclera: Conjunctivae normal.     Pupils: Pupils are equal, round, and reactive to light.  Cardiovascular:     Rate and Rhythm: Regular rhythm. Tachycardia present.     Pulses: Normal pulses.     Heart sounds: Normal heart sounds. No murmur.  Pulmonary:     Effort: Pulmonary effort is normal. No respiratory distress.     Breath sounds: Normal breath sounds. No stridor. No wheezing, rhonchi or rales.  Abdominal:     General: Abdomen is flat.     Palpations: Abdomen is soft.     Tenderness: There is no abdominal tenderness.  Musculoskeletal:        General: Normal range of motion.     Cervical back: Neck supple.  Skin:    General: Skin is warm and dry.     Capillary Refill: Capillary refill takes less than 2 seconds.  Neurological:     General: No focal deficit present.     Mental Status: She is alert and oriented to person, place, and time.  Psychiatric:        Mood and Affect: Mood normal.        Behavior: Behavior normal.      UC Treatments / Results  Labs (all labs ordered are listed, but only abnormal results are displayed) Labs Reviewed  POCT INFLUENZA A/B  POC SARS CORONAVIRUS 2 AG -  ED    EKG   Radiology No results found.  Procedures Procedures (including critical care time)  Medications Ordered in UC Medications - No data to display  Initial Impression / Assessment and Plan / UC Course  I have reviewed the triage vital signs and the nursing notes.  Pertinent labs  & imaging results that were available during my care of the patient were reviewed by me and considered in my medical decision making (see chart for details).  Clinical Course as of  Jul 05 1350  Mon Jul 06, 2019  1350 POC SARS Coronavirus 2 Ag-ED - [SM]    Clinical Course User Index [SM] Moshe Cipro, NP    During visit, pt is visibly anxious and reports that she is taking her home medications as prescribed. Discussed at length the importance of maintaining these medications (15 mins discussion) Presents with cough, shortness of breath, muscle aches, headaches. Rapid Covid in office today. Gave on refill of Trazodone. Instructed to follow up with primary care provider for her maintenance medications. Instructed on when to report to the ED.  Final Clinical Impressions(s) / UC Diagnoses   Final diagnoses:  Cough  Shortness of breath  Myalgia  Insomnia, unspecified type     Discharge Instructions     Your COVID test is pending.  You should self quarantine until your test result is back and is negative.    Take Tylenol as needed for fever or discomfort.  Rest and keep yourself hydrated.    Go to the emergency department if you develop high fever, shortness of breath, severe diarrhea, or other concerning symptoms.       ED Prescriptions    Medication Sig Dispense Auth. Provider   traZODone (DESYREL) 50 MG tablet Take 1 tablet (50 mg total) by mouth at bedtime. 30 tablet Moshe Cipro, NP     I have reviewed the PDMP during this encounter.   Moshe Cipro, NP 07/06/19 1525

## 2019-08-27 ENCOUNTER — Ambulatory Visit: Payer: PRIVATE HEALTH INSURANCE | Attending: Internal Medicine

## 2019-08-27 DIAGNOSIS — Z23 Encounter for immunization: Secondary | ICD-10-CM

## 2019-08-27 NOTE — Progress Notes (Signed)
   Covid-19 Vaccination Clinic  Name:  Sarah Espinoza    MRN: [id number] DOB: Oct 12, 1975  08/27/2019  Sarah Espinoza was observed post Covid-19 immunization for 15 minutes without incident. She was provided with Vaccine Information Sheet and instruction to access the V-Safe system.   Sarah Espinoza was instructed to call 911 with any severe reactions post vaccine: Marland Kitchen Difficulty breathing  . Swelling of face and throat  . A fast heartbeat  . A bad rash all over body  . Dizziness and weakness   Immunizations Administered    Name Date Dose VIS Date Route   Moderna COVID-19 Vaccine 08/27/2019 12:13 PM 0.5 mL 05/05/2019 Intramuscular   Manufacturer: Moderna   Lot: [id number]   NDC: 83151-761-60

## 2019-09-29 ENCOUNTER — Ambulatory Visit: Payer: PRIVATE HEALTH INSURANCE | Attending: Internal Medicine

## 2019-09-29 DIAGNOSIS — Z23 Encounter for immunization: Secondary | ICD-10-CM

## 2019-09-29 NOTE — Progress Notes (Signed)
   Covid-19 Vaccination Clinic  Name:  Sarah Espinoza    MRN: [id number] DOB: June 17, 1975  09/29/2019  [name] was observed post Covid-19 immunization for 15 minutes without incident. She was provided with Vaccine Information Sheet and instruction to access the V-Safe system.   [name] was instructed to call 911 with any severe reactions post vaccine: Marland Kitchen Difficulty breathing  . Swelling of face and throat  . A fast heartbeat  . A bad rash all over body  . Dizziness and weakness   Immunizations Administered    Name Date Dose VIS Date Route   Moderna COVID-19 Vaccine 09/29/2019 12:51 PM 0.5 mL 05/2019 Intramuscular   Manufacturer: Moderna   Lot: [id number]   NDC: 02725-366-44

## 2019-09-30 ENCOUNTER — Other Ambulatory Visit (HOSPITAL_COMMUNITY): Payer: Self-pay | Admitting: General Practice

## 2019-09-30 DIAGNOSIS — Z1231 Encounter for screening mammogram for malignant neoplasm of breast: Secondary | ICD-10-CM

## 2019-10-16 ENCOUNTER — Inpatient Hospital Stay (HOSPITAL_COMMUNITY): Admission: RE | Admit: 2019-10-16 | Payer: PRIVATE HEALTH INSURANCE | Source: Ambulatory Visit

## 2021-02-21 ENCOUNTER — Other Ambulatory Visit (HOSPITAL_COMMUNITY): Payer: Self-pay | Admitting: Family Medicine

## 2021-02-21 DIAGNOSIS — Z1231 Encounter for screening mammogram for malignant neoplasm of breast: Secondary | ICD-10-CM

## 2021-03-03 ENCOUNTER — Ambulatory Visit (HOSPITAL_COMMUNITY): Payer: No Typology Code available for payment source

## 2021-07-04 ENCOUNTER — Ambulatory Visit (INDEPENDENT_AMBULATORY_CARE_PROVIDER_SITE_OTHER): Payer: 59 | Admitting: Nurse Practitioner

## 2021-07-04 ENCOUNTER — Other Ambulatory Visit: Payer: Self-pay

## 2021-07-04 ENCOUNTER — Other Ambulatory Visit (HOSPITAL_BASED_OUTPATIENT_CLINIC_OR_DEPARTMENT_OTHER): Payer: Self-pay

## 2021-07-04 ENCOUNTER — Encounter (HOSPITAL_BASED_OUTPATIENT_CLINIC_OR_DEPARTMENT_OTHER): Payer: Self-pay | Admitting: Nurse Practitioner

## 2021-07-04 VITALS — BP 122/82 | HR 75 | Ht 65.0 in | Wt 226.0 lb

## 2021-07-04 DIAGNOSIS — R011 Cardiac murmur, unspecified: Secondary | ICD-10-CM

## 2021-07-04 DIAGNOSIS — Z130 Encounter for screening for diseases of the blood and blood-forming organs and certain disorders involving the immune mechanism: Secondary | ICD-10-CM

## 2021-07-04 DIAGNOSIS — Z0000 Encounter for general adult medical examination without abnormal findings: Secondary | ICD-10-CM

## 2021-07-04 DIAGNOSIS — L03012 Cellulitis of left finger: Secondary | ICD-10-CM

## 2021-07-04 DIAGNOSIS — Z1329 Encounter for screening for other suspected endocrine disorder: Secondary | ICD-10-CM

## 2021-07-04 DIAGNOSIS — R002 Palpitations: Secondary | ICD-10-CM

## 2021-07-04 DIAGNOSIS — Z13228 Encounter for screening for other metabolic disorders: Secondary | ICD-10-CM

## 2021-07-04 DIAGNOSIS — Z1321 Encounter for screening for nutritional disorder: Secondary | ICD-10-CM

## 2021-07-04 DIAGNOSIS — Z131 Encounter for screening for diabetes mellitus: Secondary | ICD-10-CM

## 2021-07-04 DIAGNOSIS — E559 Vitamin D deficiency, unspecified: Secondary | ICD-10-CM | POA: Diagnosis not present

## 2021-07-04 DIAGNOSIS — E039 Hypothyroidism, unspecified: Secondary | ICD-10-CM | POA: Diagnosis not present

## 2021-07-04 DIAGNOSIS — I10 Essential (primary) hypertension: Secondary | ICD-10-CM

## 2021-07-04 DIAGNOSIS — F319 Bipolar disorder, unspecified: Secondary | ICD-10-CM | POA: Diagnosis not present

## 2021-07-04 HISTORY — DX: Palpitations: R00.2

## 2021-07-04 HISTORY — DX: Encounter for screening for diabetes mellitus: Z13.1

## 2021-07-04 HISTORY — DX: Cellulitis of left finger: L03.012

## 2021-07-04 MED ORDER — VITAMIN D (ERGOCALCIFEROL) 1.25 MG (50000 UNIT) PO CAPS
50000.0000 [IU] | ORAL_CAPSULE | ORAL | 1 refills | Status: DC
  Filled 2021-07-04: qty 12, 84d supply, fill #0

## 2021-07-04 MED ORDER — FLUCONAZOLE 150 MG PO TABS
150.0000 mg | ORAL_TABLET | Freq: Once | ORAL | 0 refills | Status: AC
  Filled 2021-07-04: qty 1, 1d supply, fill #0

## 2021-07-04 MED ORDER — SULFAMETHOXAZOLE-TRIMETHOPRIM 800-160 MG PO TABS
1.0000 | ORAL_TABLET | Freq: Two times a day (BID) | ORAL | 0 refills | Status: DC
Start: 2021-07-04 — End: 2021-08-29
  Filled 2021-07-04: qty 6, 3d supply, fill #0

## 2021-07-04 MED ORDER — MUPIROCIN 2 % EX OINT
TOPICAL_OINTMENT | CUTANEOUS | 3 refills | Status: DC
  Filled 2021-07-04: qty 22, 7d supply, fill #0

## 2021-07-04 NOTE — Assessment & Plan Note (Signed)
Longstanding history of vitamin D deficiency currently taking 10,000 IU on a daily basis. Will transition to 50,000 IU weekly for ease of dosing and appropriate management.  We will obtain vitamin D levels today and plan to follow-up in approximately 6 months to ensure levels are appropriate.

## 2021-07-04 NOTE — Assessment & Plan Note (Signed)
Intermittent palpitations reported by patient.  HRRR on evaluation today.  She does have a murmur present.  Recent diagnosis of hypothyroidism not currently on any medications could be contributing to this.  Also consider anxiety contributing as well. We will work to get thyroid levels under control and continue to monitor.  She does have a referral for cardiology due to presence of heart murmur. Recommend follow-up if symptoms worsen or fail to improve.

## 2021-07-04 NOTE — Assessment & Plan Note (Signed)
Symptoms and presentation consistent with paronychia. Recommendation for mupirocin ointment twice a day for the next 7 days with dry bandage.  We will also send Bactrim twice daily for 3 days as infection does appear to be quite significant and patient does work with the public. Recommend keeping the finger clean and dry and replacing the bandage if it gets wet to prevent maceration or further infection. Discussed with the patient not to cut, pick, prick, or perform any trauma to the area as this can make the infection worse. We will send Diflucan for possible yeast infection that patient reports she frequently gets with antibiotic use. She will follow-up if symptoms worsen or fail to improve.

## 2021-07-04 NOTE — Progress Notes (Signed)
Sarah Eth, DNP, AGNP-c [facility] [address]   [address] [place], [place] [address] [phone]  New patient visit   Patient: Sarah Espinoza   DOB: 03-29-1976   46 y.o. Female  MRN: [id number] Visit Date: 07/04/2021  Patient Care Team: Tesean Stump, Sung Amabile, NP as PCP - General (Nurse Practitioner)  Today's healthcare provider: [name], NP   Chief Complaint  Patient presents with   New Patient (Initial Visit)    Patient comes in today to establish care. She is a Producer, television/film/video. Her concerns are her tyroid levels, she is onboard with a full lab panel today. She is concerned about her left index finger, she thought it was an ingrown.It is very red and painful.    Subjective    HPI HPI     New Patient (Initial Visit)    Additional comments: Patient comes in today to establish care. She is a Producer, television/film/video. Her concerns are her tyroid levels, she is onboard with a full lab panel today. She is concerned about her left index finger, she thought it was an ingrown.It is very red and painful.       Last edited by Carnella Guadalajara, CMA on 07/04/2021  9:51 AM.      Sarah Espinoza is a 46 y.o. female who presents today as a new patient to establish care.    She has concerns today with a recent lab result that showed elevation in TSH levels.  She also has concerns with an infection on her left index finger.  Hypothyroidism Labs from September 2022 with previous PCP did show an elevation in thyroid levels.  Unfortunately the patient did not have insurance and was unable to go back to be seen for this and has not been started on medication at this time. She does endorse increased anxiety and depression symptoms, increased sensitivity to cold, weight gain, fatigue, and intermittent nausea.  She tells me she has gained approximately 20 pounds since this past summer which is quite concerning.  Paronychia She endorses a painful, red, swollen  area along the lateral side of the nailbed on the left index finger that has been present for the last few days.  She does endorse that she had some thickened skin which she cut off and she noted changes to this shortly thereafter.  She has had some bloody and purulent drainage starting last night.  She has been using a Band-Aid and over-the-counter antibiotic ointment to the area to help.  She endorses extreme tenderness to the area.  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  She also reports a history of bipolar disorder for which she has previously been seen by psychiatric services.  She would like referral for this today.  She does feel her symptoms are worse recently however she is not sure if this is related to the thyroid or not.  She also has a history of hypertension which is well controlled on medications. She endorses a history of vitamin D deficiency with daily 10,000 IU replacement.  She is interested in the 50,000 IU once a week dosing if possible.  Past Medical History:  Diagnosis Date   Bipolar 1 disorder ([facility])    Hypertension    Past Surgical History:  Procedure Laterality Date   LEEP  2006   Family Status  Relation Name Status  Mother  (Not Specified)   Father  (Not Specified)   Family History  Problem Relation Age of Onset   Healthy Mother    Healthy Father    Social History   Socioeconomic History   Marital status: Divorced    Spouse name: Not on file   Number of children: Not on file   Years of education: Not on file   Highest education level: Not on file  Occupational History   Not on file  Tobacco Use   Smoking status: Every Day    Packs/day: 1.00    Types: Cigarettes   Smokeless tobacco: Never  Vaping Use   Vaping Use: Never used  Substance and Sexual Activity   Alcohol use: No   Drug use: No   Sexual activity: Yes    Birth control/protection: None  Other  Topics Concern   Not on file  Social History Narrative   Not on file   Social Determinants of Health   Financial Resource Strain: Not on file  Food Insecurity: Not on file  Transportation Needs: Not on file  Physical Activity: Not on file  Stress: Not on file  Social Connections: Not on file   Outpatient Medications Prior to Visit  Medication Sig   clonazePAM (KLONOPIN) 0.5 MG tablet Take 0.5 mg by mouth 2 (two) times daily as needed.   FLUoxetine (PROZAC) 40 MG capsule Take 40 mg by mouth 2 (two) times daily.   gabapentin (NEURONTIN) 600 MG tablet Take 600 mg by mouth 3 (three) times daily.   lisinopril-hydrochlorothiazide (ZESTORETIC) 20-25 MG tablet Take 1 tablet by mouth daily.   omeprazole (PRILOSEC) 20 MG capsule Take 1 capsule (20 mg total) by mouth 2 (two) times daily before a meal.   Oxcarbazepine (TRILEPTAL) 300 MG tablet Take 600 mg by mouth daily.   traZODone (DESYREL) 150 MG tablet Take 150 mg by mouth at bedtime.   [DISCONTINUED] gabapentin (NEURONTIN) 800 MG tablet Take 1 tablet (800 mg total) by mouth QID for 30 days.   [DISCONTINUED] montelukast (SINGULAIR) 10 MG tablet Take 10 mg by mouth daily.   [DISCONTINUED] OXcarbazepine (TRILEPTAL) 150 MG tablet Take 150 mg by mouth 2 (two) times daily.   [DISCONTINUED] traZODone (DESYREL) 50 MG tablet Take 1 tablet (50 mg total) by mouth at bedtime.   No facility-administered medications prior to visit.   No Known Allergies  Immunization History  Administered Date(s) Administered   Moderna Sars-Covid-2 Vaccination 08/27/2019, 09/29/2019    Health Maintenance  Topic Date Due   HIV Screening  Never done   Hepatitis C Screening  Never done   TETANUS/TDAP  Never done   PAP SMEAR-Modifier  Never done   COVID-19 Vaccine (3 - Booster for Moderna series) 11/24/2019   INFLUENZA VACCINE  09/01/2021 (Originally 01/02/2021)   HPV VACCINES  Aged Out    Patient Care Team: Kevyn Wengert, Coralee Pesa, NP as PCP - General (Nurse  Practitioner)  Review of Systems All review of systems negative except what is listed in the HPI   Objective    BP 122/82    Pulse 75    Ht 5\' 5"  (1.651 m)    Wt 226 lb (102.5 kg)    SpO2 95%    BMI 37.61 kg/m  Physical Exam Vitals and nursing note reviewed.  Constitutional:      Appearance: Normal appearance.  HENT:     Head: Normocephalic.  Eyes:     Extraocular Movements: Extraocular movements intact.     Conjunctiva/sclera:  Conjunctivae normal.     Pupils: Pupils are equal, round, and reactive to light.  Neck:     Vascular: No carotid bruit.  Cardiovascular:     Rate and Rhythm: Normal rate and regular rhythm. No extrasystoles are present.    Chest Wall: PMI is not displaced. No thrill.     Pulses: Normal pulses.     Heart sounds: Murmur heard.  Pulmonary:     Effort: Pulmonary effort is normal.     Breath sounds: Normal breath sounds. No wheezing.  Abdominal:     General: Bowel sounds are normal. There is no distension.     Palpations: Abdomen is soft.     Tenderness: There is no abdominal tenderness. There is no guarding.  Musculoskeletal:        General: Normal range of motion.     Cervical back: Normal range of motion. No tenderness.     Right lower leg: No edema.     Left lower leg: No edema.  Lymphadenopathy:     Cervical: No cervical adenopathy.  Skin:    General: Skin is warm and dry.     Capillary Refill: Capillary refill takes less than 2 seconds.     Findings: Erythema present.     Comments: Erythematous, warm, swollen area to the lateral portion of the left index finger along the nailbed.  No drainage able to be expressed at this time however dried drainage is present.  Mild maceration noted to the surrounding tissue of infection.  Neurological:     General: No focal deficit present.     Mental Status: She is alert and oriented to person, place, and time.  Psychiatric:        Mood and Affect: Mood normal.        Behavior: Behavior normal.         Thought Content: Thought content normal.        Judgment: Judgment normal.    Depression Screen PHQ 2/9 Scores 07/04/2021 01/21/2019  PHQ - 2 Score 2 6  PHQ- 9 Score 7 18   No results found for any visits on 07/04/21.  Assessment & Plan      Problem List Items Addressed This Visit     Vitamin D deficiency    Longstanding history of vitamin D deficiency currently taking 10,000 IU on a daily basis. Will transition to 50,000 IU weekly for ease of dosing and appropriate management.  We will obtain vitamin D levels today and plan to follow-up in approximately 6 months to ensure levels are appropriate.      Relevant Medications   Vitamin D, Ergocalciferol, (DRISDOL) 1.25 MG (50000 UNIT) CAPS capsule   Other Relevant Orders   VITAMIN D 25 Hydroxy (Vit-D Deficiency, Fractures)   Hypothyroidism - Primary    Lab results from September 2022 do show evidence of hypothyroidism. She is not currently on any medications that she was unable to return for the follow-up visit to the last provider. Recommendations at this time are to obtain labs today and will evaluate levels and initiate levothyroxine replacement at appropriate dose once we have levels back. She is having symptoms at this time however no severe alarm symptoms present. We will plan to follow-up in 6 to 8 weeks for recheck once she has been started on the medication.       Relevant Orders   TSH   T3   T4   Bipolar disorder ([facility])    History of bipolar disorder currently on  multiple medications.  She has previously been managed by psychiatry and psychology in the past.  We will send referral today for recommendations and evaluation. Recommend follow-up if symptoms worsen or she has difficulty getting in with psychiatry. No alarm symptoms present today appears to be stable.      Relevant Orders   Ambulatory referral to Psychiatry   Primary hypertension    Blood pressure looks great today.  122/82. We will obtain baseline labs  today for evaluation. Recommend staying on current medication and dosage and monitoring for worsening hypertension or any new symptoms.       Relevant Orders   Lipid panel   Hemoglobin A1c   CBC with Differential/Platelet   Comprehensive metabolic panel   Paronychia of left index finger    Symptoms and presentation consistent with paronychia. Recommendation for mupirocin ointment twice a day for the next 7 days with dry bandage.  We will also send Bactrim twice daily for 3 days as infection does appear to be quite significant and patient does work with the public. Recommend keeping the finger clean and dry and replacing the bandage if it gets wet to prevent maceration or further infection. Discussed with the patient not to cut, pick, prick, or perform any trauma to the area as this can make the infection worse. We will send Diflucan for possible yeast infection that patient reports she frequently gets with antibiotic use. She will follow-up if symptoms worsen or fail to improve.      Relevant Medications   mupirocin ointment (BACTROBAN) 2 %   sulfamethoxazole-trimethoprim (BACTRIM DS) 800-160 MG tablet   fluconazole (DIFLUCAN) 150 MG tablet   Other Relevant Orders   CBC with Differential/Platelet   Encounter for screening for diabetes mellitus   Relevant Orders   Hemoglobin A1c   Heart murmur    Heart murmur detected on evaluation today.  Patient does report that this has been mentioned to her 1 time in the past however she has not had any longstanding history that she knows of. She is not currently having any alarm symptoms however given the new onset of the murmur I do feel that evaluation is appropriate with cardiology. Will send cardiology referral and follow for recommendations on treatment and plan of care.      Relevant Orders   Ambulatory referral to Cardiology   Intermittent palpitations    Intermittent palpitations reported by patient.  HRRR on evaluation today.  She  does have a murmur present.  Recent diagnosis of hypothyroidism not currently on any medications could be contributing to this.  Also consider anxiety contributing as well. We will work to get thyroid levels under control and continue to monitor.  She does have a referral for cardiology due to presence of heart murmur. Recommend follow-up if symptoms worsen or fail to improve.      Relevant Orders   Ambulatory referral to Cardiology   Other Visit Diagnoses     Encounter for medical examination to establish care       Screening for endocrine, nutritional, metabolic and immunity disorder       Relevant Medications   Vitamin D, Ergocalciferol, (DRISDOL) 1.25 MG (50000 UNIT) CAPS capsule   Other Relevant Orders   Lipid panel   Hemoglobin A1c   TSH   VITAMIN D 25 Hydroxy (Vit-D Deficiency, Fractures)   CBC with Differential/Platelet   Comprehensive metabolic panel   123456 and Folate Panel   T3   T4  Return in about 8 weeks (around 08/29/2021) for thyroid and htn follow-up.      Wheeler Incorvaia, Coralee Pesa, NP, DNP, AGNP-C [facility] at [place]

## 2021-07-04 NOTE — Assessment & Plan Note (Signed)
Blood pressure looks great today.  122/82. We will obtain baseline labs today for evaluation. Recommend staying on current medication and dosage and monitoring for worsening hypertension or any new symptoms.

## 2021-07-04 NOTE — Assessment & Plan Note (Signed)
Lab results from September 2022 do show evidence of hypothyroidism. She is not currently on any medications that she was unable to return for the follow-up visit to the last provider. Recommendations at this time are to obtain labs today and will evaluate levels and initiate levothyroxine replacement at appropriate dose once we have levels back. She is having symptoms at this time however no severe alarm symptoms present. We will plan to follow-up in 6 to 8 weeks for recheck once she has been started on the medication.

## 2021-07-04 NOTE — Assessment & Plan Note (Signed)
History of bipolar disorder currently on multiple medications.  She has previously been managed by psychiatry and psychology in the past.  We will send referral today for recommendations and evaluation. Recommend follow-up if symptoms worsen or she has difficulty getting in with psychiatry. No alarm symptoms present today appears to be stable.

## 2021-07-04 NOTE — Patient Instructions (Addendum)
Thank you for choosing [place] at [facility] for your Primary Care needs. I am excited for the opportunity to partner with you to meet your health care goals. It was a pleasure meeting you today!  Recommendations from today's visit: We will plan to get some labs today to see where the thyroid level is at this time and determine where we need to start your medication doses.  I will send the referral in to the psychiatrist. You should hear from them in the next week to schedule an appointment.  I have sent in a prescription for an antibiotic ointment that I want you to use twice a day for the next 7 days. I have also sent in an oral antibiotic for you to use to help get this cleared up faster.   Information on diet, exercise, and health maintenance recommendations are listed below. This is information to help you be sure you are on track for optimal health and monitoring.   Please look over this and let us know if you have any questions or if you have completed any of the health maintenance outside of [place] so that we can be sure your records are up to date.  ___________________________________________________________ About Me: I am an Adult-Geriatric Nurse Practitioner with a background in caring for patients for more than 20 years with a strong intensive care background. I provide primary care and sports medicine services to patients age 44 and older within this office. My education had a strong focus on caring for the older adult population, which I am passionate about. I am also the director of the APP Fellowship with [facility].   My desire is to provide you with the best service through preventive medicine and supportive care. I consider you a part of the medical team and value your input. I work diligently to ensure that you are heard and your needs are met in a safe and effective manner. I want you to feel comfortable with me as your provider and want you to know  that your health concerns are important to me.  For your information, our office hours are: Monday, Tuesday, and Thursday 8:00 AM - 5:00 PM Wednesday and Friday 8:00 AM - 12:00 PM.   In my time away from the office I am teaching new APP's within the system and am unavailable, but my partner, Dr. Burnard Bunting is in the office for emergent needs.   If you have questions or concerns, please call our office at [phone] or send [place] a MyChart message and we will respond as quickly as possible.  ____________________________________________________________ MyChart:  For all urgent or time sensitive needs we ask that you please call the office to avoid delays. Our number is [phone]. MyChart is not constantly monitored and due to the large volume of messages a day, replies may take up to 72 business hours.  MyChart Policy: MyChart allows for you to see your visit notes, after visit summary, provider recommendations, lab and tests results, make an appointment, request refills, and contact your provider or the office for non-urgent questions or concerns. Providers are seeing patients during normal business hours and do not have built in time to review MyChart messages.  We ask that you allow a minimum of 3 business days for responses to [employer]. For this reason, please do not send urgent requests through [place]. Please call the office at [phone]. New and ongoing conditions may require a visit. We have virtual and in person  visit available for your convenience.  Complex MyChart concerns may require a visit. Your provider may request you schedule a virtual or in person visit to ensure we are providing the best care possible. MyChart messages sent after 11:00 AM on Friday will not be received by the provider until Monday morning.    Lab and Test Results: You will receive your lab and test results on MyChart as soon as they are completed and results have been sent by the lab or testing  facility. Due to this service, you will receive your results BEFORE your provider.  I review lab and tests results each morning prior to seeing patients. Some results require collaboration with other providers to ensure you are receiving the most appropriate care. For this reason, we ask that you please allow a minimum of 3-5 business days from the time the ALL results have been received for your provider to receive and review lab and test results and contact you about these.  Most lab and test result comments from the provider will be sent through [place]. Your provider may recommend changes to the plan of care, follow-up visits, repeat testing, ask questions, or request an office visit to discuss these results. You may reply directly to this message or call the office at [phone] to provide information for the provider or set up an appointment. In some instances, you will be called with test results and recommendations. Please let us know if this is preferred and we will make note of this in your chart to provide this for you.    If you have not heard a response to your lab or test results in 5 business days from all results returning to [place], please call the office to let us know. We ask that you please avoid calling prior to this time unless there is an emergent concern. Due to high call volumes, this can delay the resulting process.  After Hours: For all non-emergency after hours needs, please call the office at [phone] and select the option to reach the on-call provider service. On-call services are shared between multiple [place] offices and therefore it will not be possible to speak directly with your provider. On-call providers may provide medical advice and recommendations, but are unable to provide refills for maintenance medications.  For all emergency or urgent medical needs after normal business hours, we recommend that you seek care at the closest Urgent Care or Emergency  Department to ensure appropriate treatment in a timely manner.  [facility] [place] has a 24 hour emergency room located on the ground floor for your convenience.   Urgent Concerns During the Business Day Providers are seeing patients from 8AM to [place] with a busy schedule and are most often not able to respond to non-urgent calls until the end of the day or the next business day. If you should have URGENT concerns during the day, please call and speak to the nurse or schedule a same day appointment so that we can address your concern without delay.   Thank you, again, for choosing me as your health care partner. I appreciate your trust and look forward to learning more about you.   Sarah Keeler, DNP, AGNP-c ___________________________________________________________  Health Maintenance Recommendations Screening Testing Mammogram Every 1 -2 years based on history and risk factors Starting at age 51 Pap Smear Ages 21-39 every 3 years Ages 75-65 every 5 years with HPV testing More frequent testing may be required based on results and history Colon  Cancer Screening Every 1-10 years based on test performed, risk factors, and history Starting at age 42 Bone Density Screening Every 2-10 years based on history Starting at age 31 for women Recommendations for men differ based on medication usage, history, and risk factors AAA Screening One time ultrasound Men 30-56 years old who have every smoked Lung Cancer Screening Low Dose Lung CT every 12 months Age 62-80 years with a 30 pack-year smoking history who still smoke or who have quit within the last 15 years  Screening Labs Routine  Labs: Complete Blood Count (CBC), Complete Metabolic Panel (CMP), Cholesterol (Lipid Panel) Every 6-12 months based on history and medications May be recommended more frequently based on current conditions or previous results Hemoglobin A1c Lab Every 3-12 months based on history and previous  results Starting at age 5 or earlier with diagnosis of diabetes, high cholesterol, BMI >26, and/or risk factors Frequent monitoring for patients with diabetes to ensure blood sugar control Thyroid Panel (TSH w/ T3 & T4) Every 6 months based on history, symptoms, and risk factors May be repeated more often if on medication HIV One time testing for all patients 64 and older May be repeated more frequently for patients with increased risk factors or exposure Hepatitis C One time testing for all patients 32 and older May be repeated more frequently for patients with increased risk factors or exposure Gonorrhea, Chlamydia Every 12 months for all sexually active persons 13-24 years Additional monitoring may be recommended for those who are considered high risk or who have symptoms PSA Men 63-102 years old with risk factors Additional screening may be recommended from age 67-69 based on risk factors, symptoms, and history  Vaccine Recommendations Tetanus Booster All adults every 10 years Flu Vaccine All patients 6 months and older every year COVID Vaccine All patients 12 years and older Initial dosing with booster May recommend additional booster based on age and health history HPV Vaccine 2 doses all patients age 43-26 Dosing may be considered for patients over 26 Shingles Vaccine (Shingrix) 2 doses all adults 75 years and older Pneumonia (Pneumovax 23) All adults 43 years and older May recommend earlier dosing based on health history Pneumonia (Prevnar 48) All adults 98 years and older Dosed 1 year after Pneumovax 23  Additional Screening, Testing, and Vaccinations may be recommended on an individualized basis based on family history, health history, risk factors, and/or exposure.  __________________________________________________________  Diet Recommendations for All Patients  I recommend that all patients maintain a diet low in saturated fats, carbohydrates, and cholesterol.  While this can be challenging at first, it is not impossible and small changes can make big differences.  Things to try: Decreasing the amount of soda, sweet tea, and/or juice to one or less per day and replace with water While water is always the first choice, if you do not like water you may consider adding a water additive without sugar to improve the taste other sugar free drinks Replace potatoes with a brightly colored vegetable at dinner Use healthy oils, such as canola oil or olive oil, instead of butter or hard margarine Limit your bread intake to two pieces or less a day Replace regular pasta with low carb pasta options Bake, broil, or grill foods instead of frying Monitor portion sizes  Eat smaller, more frequent meals throughout the day instead of large meals  An important thing to remember is, if you love foods that are not great for your health, you don't have to give  them up completely. Instead, allow these foods to be a reward when you have done well. Allowing yourself to still have special treats every once in a while is a nice way to tell yourself thank you for working hard to keep yourself healthy.   Also remember that every day is a new day. If you have a bad day and "fall off the wagon", you can still climb right back up and keep moving along on your journey!  We have resources available to help you!  Some websites that may be helpful include: www.http://carter.biz/  Www.VeryWellFit.com _____________________________________________________________  Activity Recommendations for All Patients  I recommend that all adults get at least 20 minutes of moderate physical activity that elevates your heart rate at least 5 days out of the week.  Some examples include: Walking or jogging at a pace that allows you to carry on a conversation Cycling (stationary bike or outdoors) Water aerobics Yoga Weight lifting Dancing If physical limitations prevent you from putting stress on your  joints, exercise in a pool or seated in a chair are excellent options.  Do determine your MAXIMUM heart rate for activity: YOUR AGE - 220 = MAX HeartRate   Remember! Do not push yourself too hard.  Start slowly and build up your pace, speed, weight, time in exercise, etc.  Allow your body to rest between exercise and get good sleep. You will need more water than normal when you are exerting yourself. Do not wait until you are thirsty to drink. Drink with a purpose of getting in at least 8, 8 ounce glasses of water a day plus more depending on how much you exercise and sweat.    If you begin to develop dizziness, chest pain, abdominal pain, jaw pain, shortness of breath, headache, vision changes, lightheadedness, or other concerning symptoms, stop the activity and allow your body to rest. If your symptoms are severe, seek emergency evaluation immediately. If your symptoms are concerning, but not severe, please let us know so that we can recommend further evaluation.

## 2021-07-04 NOTE — Assessment & Plan Note (Signed)
Heart murmur detected on evaluation today.  Patient does report that this has been mentioned to her 1 time in the past however she has not had any longstanding history that she knows of. She is not currently having any alarm symptoms however given the new onset of the murmur I do feel that evaluation is appropriate with cardiology. Will send cardiology referral and follow for recommendations on treatment and plan of care.

## 2021-07-05 LAB — CBC WITH DIFFERENTIAL/PLATELET
Basophils Absolute: 0 10*3/uL (ref 0.0–0.2)
Basos: 1 %
EOS (ABSOLUTE): 0.1 10*3/uL (ref 0.0–0.4)
Eos: 1 %
Hematocrit: 41.5 % (ref 34.0–46.6)
Hemoglobin: 14 g/dL (ref 11.1–15.9)
Immature Grans (Abs): 0 10*3/uL (ref 0.0–0.1)
Immature Granulocytes: 0 %
Lymphocytes Absolute: 1.2 10*3/uL (ref 0.7–3.1)
Lymphs: 15 %
MCH: 31.8 pg (ref 26.6–33.0)
MCHC: 33.7 g/dL (ref 31.5–35.7)
MCV: 94 fL (ref 79–97)
Monocytes Absolute: 0.6 10*3/uL (ref 0.1–0.9)
Monocytes: 7 %
Neutrophils Absolute: 6 10*3/uL (ref 1.4–7.0)
Neutrophils: 76 %
Platelets: 275 10*3/uL (ref 150–450)
RBC: 4.4 x10E6/uL (ref 3.77–5.28)
RDW: 14 % (ref 11.7–15.4)
WBC: 7.9 10*3/uL (ref 3.4–10.8)

## 2021-07-05 LAB — COMPREHENSIVE METABOLIC PANEL
ALT: 14 IU/L (ref 0–32)
AST: 24 IU/L (ref 0–40)
Albumin/Globulin Ratio: 1.7 (ref 1.2–2.2)
Albumin: 4.5 g/dL (ref 3.8–4.8)
Alkaline Phosphatase: 121 IU/L (ref 44–121)
BUN/Creatinine Ratio: 21 (ref 9–23)
BUN: 20 mg/dL (ref 6–24)
Bilirubin Total: <0.2 mg/dL (ref 0.0–1.2)
CO2: 19 mmol/L — ABNORMAL LOW (ref 20–29)
Calcium: 9.4 mg/dL (ref 8.7–10.2)
Chloride: 99 mmol/L (ref 96–106)
Creatinine, Ser: 0.94 mg/dL (ref 0.57–1.00)
Globulin, Total: 2.6 g/dL (ref 1.5–4.5)
Glucose: 76 mg/dL (ref 70–99)
Potassium: 5 mmol/L (ref 3.5–5.2)
Sodium: 136 mmol/L (ref 134–144)
Total Protein: 7.1 g/dL (ref 6.0–8.5)
eGFR: 77 mL/min/{1.73_m2} (ref >59)

## 2021-07-05 LAB — T4: T4, Total: 5.7 ug/dL (ref 4.5–12.0)

## 2021-07-05 LAB — B12 AND FOLATE PANEL
Folate: 11 ng/mL (ref >3.0)
Vitamin B-12: 560 pg/mL (ref 232–1245)

## 2021-07-05 LAB — LIPID PANEL
Chol/HDL Ratio: 3 ratio (ref 0.0–4.4)
Cholesterol, Total: 214 mg/dL — ABNORMAL HIGH (ref 100–199)
HDL: 72 mg/dL (ref >39)
LDL Chol Calc (NIH): 126 mg/dL — ABNORMAL HIGH (ref 0–99)
Triglycerides: 93 mg/dL (ref 0–149)
VLDL Cholesterol Cal: 16 mg/dL (ref 5–40)

## 2021-07-05 LAB — HEMOGLOBIN A1C
Est. average glucose Bld gHb Est-mCnc: 91 mg/dL
Hgb A1c MFr Bld: 4.8 % (ref 4.8–5.6)

## 2021-07-05 LAB — T3: T3, Total: 90 ng/dL (ref 71–180)

## 2021-07-05 LAB — TSH: TSH: 3.39 u[IU]/mL (ref 0.450–4.500)

## 2021-07-05 LAB — VITAMIN D 25 HYDROXY (VIT D DEFICIENCY, FRACTURES): Vit D, 25-Hydroxy: 59.4 ng/mL (ref 30.0–100.0)

## 2021-07-19 ENCOUNTER — Other Ambulatory Visit (HOSPITAL_BASED_OUTPATIENT_CLINIC_OR_DEPARTMENT_OTHER): Payer: Self-pay

## 2021-07-19 ENCOUNTER — Encounter (HOSPITAL_BASED_OUTPATIENT_CLINIC_OR_DEPARTMENT_OTHER): Payer: Self-pay

## 2021-07-19 MED ORDER — GABAPENTIN 600 MG PO TABS
600.0000 mg | ORAL_TABLET | Freq: Three times a day (TID) | ORAL | 0 refills | Status: DC
  Filled 2021-07-19: qty 90, 30d supply, fill #0

## 2021-07-20 NOTE — Telephone Encounter (Signed)
This encounter was created in error - please disregard.

## 2021-07-21 ENCOUNTER — Other Ambulatory Visit (HOSPITAL_BASED_OUTPATIENT_CLINIC_OR_DEPARTMENT_OTHER): Payer: Self-pay

## 2021-07-21 ENCOUNTER — Telehealth (HOSPITAL_BASED_OUTPATIENT_CLINIC_OR_DEPARTMENT_OTHER): Payer: Self-pay

## 2021-07-21 MED ORDER — GABAPENTIN 600 MG PO TABS: 600.0000 mg | ORAL_TABLET | Freq: Three times a day (TID) | ORAL | 1 refills | Status: DC

## 2021-08-12 ENCOUNTER — Other Ambulatory Visit (HOSPITAL_BASED_OUTPATIENT_CLINIC_OR_DEPARTMENT_OTHER): Payer: Self-pay | Admitting: Nurse Practitioner

## 2021-08-13 MED ORDER — FLUOXETINE HCL 40 MG PO CAPS
40.0000 mg | ORAL_CAPSULE | Freq: Two times a day (BID) | ORAL | 1 refills | Status: DC
  Filled 2021-08-13: qty 90, 45d supply, fill #0

## 2021-08-14 ENCOUNTER — Telehealth (HOSPITAL_BASED_OUTPATIENT_CLINIC_OR_DEPARTMENT_OTHER): Payer: Self-pay

## 2021-08-14 ENCOUNTER — Encounter (HOSPITAL_BASED_OUTPATIENT_CLINIC_OR_DEPARTMENT_OTHER): Payer: Self-pay | Admitting: Pharmacist

## 2021-08-14 ENCOUNTER — Other Ambulatory Visit (HOSPITAL_BASED_OUTPATIENT_CLINIC_OR_DEPARTMENT_OTHER): Payer: Self-pay

## 2021-08-14 MED ORDER — FLUOXETINE HCL 40 MG PO CAPS: 40.0000 mg | ORAL_CAPSULE | Freq: Two times a day (BID) | ORAL | 1 refills | Status: DC

## 2021-08-17 ENCOUNTER — Other Ambulatory Visit (HOSPITAL_BASED_OUTPATIENT_CLINIC_OR_DEPARTMENT_OTHER): Payer: Self-pay | Admitting: Nurse Practitioner

## 2021-08-17 ENCOUNTER — Telehealth (HOSPITAL_BASED_OUTPATIENT_CLINIC_OR_DEPARTMENT_OTHER): Payer: Self-pay

## 2021-08-17 MED ORDER — CLONAZEPAM 0.5 MG PO TABS: 0.5000 mg | ORAL_TABLET | Freq: Two times a day (BID) | ORAL | 0 refills | Status: DC | PRN

## 2021-08-18 ENCOUNTER — Other Ambulatory Visit (HOSPITAL_BASED_OUTPATIENT_CLINIC_OR_DEPARTMENT_OTHER): Payer: Self-pay | Admitting: Nurse Practitioner

## 2021-08-18 DIAGNOSIS — F319 Bipolar disorder, unspecified: Secondary | ICD-10-CM

## 2021-08-21 ENCOUNTER — Telehealth (HOSPITAL_BASED_OUTPATIENT_CLINIC_OR_DEPARTMENT_OTHER): Payer: Self-pay

## 2021-08-21 ENCOUNTER — Other Ambulatory Visit (HOSPITAL_BASED_OUTPATIENT_CLINIC_OR_DEPARTMENT_OTHER): Payer: Self-pay

## 2021-08-21 MED ORDER — TRAZODONE HCL 150 MG PO TABS: 150.0000 mg | ORAL_TABLET | Freq: Every day | ORAL | 3 refills | Status: DC

## 2021-08-21 MED ORDER — TRAZODONE HCL 150 MG PO TABS
150.0000 mg | ORAL_TABLET | Freq: Every day | ORAL | 3 refills | Status: DC
  Filled 2021-08-21: qty 90, 90d supply, fill #0

## 2021-08-23 ENCOUNTER — Other Ambulatory Visit: Payer: Self-pay

## 2021-08-23 ENCOUNTER — Ambulatory Visit
Admission: EM | Admit: 2021-08-23 | Discharge: 2021-08-23 | Disposition: A | Discharge status: Home / Self Care | Payer: 59 | Attending: Urgent Care | Admitting: Urgent Care

## 2021-08-23 ENCOUNTER — Ambulatory Visit (INDEPENDENT_AMBULATORY_CARE_PROVIDER_SITE_OTHER): Payer: 59

## 2021-08-23 DIAGNOSIS — J209 Acute bronchitis, unspecified: Secondary | ICD-10-CM | POA: Diagnosis not present

## 2021-08-23 DIAGNOSIS — R062 Wheezing: Secondary | ICD-10-CM

## 2021-08-23 DIAGNOSIS — F17200 Nicotine dependence, unspecified, uncomplicated: Secondary | ICD-10-CM

## 2021-08-23 DIAGNOSIS — R059 Cough, unspecified: Secondary | ICD-10-CM | POA: Diagnosis not present

## 2021-08-23 DIAGNOSIS — R0989 Other specified symptoms and signs involving the circulatory and respiratory systems: Secondary | ICD-10-CM | POA: Diagnosis not present

## 2021-08-23 MED ORDER — ALBUTEROL SULFATE HFA 108 (90 BASE) MCG/ACT IN AERS: 1.0000 | INHALATION_SPRAY | Freq: Four times a day (QID) | RESPIRATORY_TRACT | 0 refills | Status: DC | PRN

## 2021-08-23 MED ORDER — LEVOCETIRIZINE DIHYDROCHLORIDE 5 MG PO TABS: 5.0000 mg | ORAL_TABLET | Freq: Every evening | ORAL | 0 refills | Status: DC

## 2021-08-23 MED ORDER — PROMETHAZINE-DM 6.25-15 MG/5ML PO SYRP: 5.0000 mL | ORAL_SOLUTION | Freq: Four times a day (QID) | ORAL | 0 refills | Status: DC | PRN

## 2021-08-23 MED ORDER — PREDNISONE 50 MG PO TABS: 50.0000 mg | ORAL_TABLET | Freq: Every day | ORAL | 0 refills | Status: DC

## 2021-08-23 MED ORDER — METHYLPREDNISOLONE SODIUM SUCC 125 MG IJ SOLR
125.0000 mg | Freq: Once | INTRAMUSCULAR | Status: AC
  Administered 2021-08-23: 125 mg via INTRAMUSCULAR

## 2021-08-23 NOTE — ED Provider Notes (Signed)
?[facility] ? ? ?MRN: [id number] DOB: [birth year] ? ?Subjective:  ? ?Sarah Espinoza is a 46 y.o. female presenting for 3 month history of persistent coughing, chest congestion, chest pressure.  She is also had a runny nose for the past day.  Has felt chills, fatigue and malaise.  She is a smoker, is currently at 1/2ppd but has previously done 1 PPD.  No history of asthma or COPD but would like an inhaler.  No chest pain, shortness of breath. ? ?No current facility-administered medications for this encounter. ? ?Current Outpatient Medications:  ?  clonazePAM (KLONOPIN) 0.5 MG tablet, Take 1 tablet (0.5 mg total) by mouth 2 (two) times daily as needed., Disp: 30 tablet, Rfl: 0 ?  FLUoxetine (PROZAC) 40 MG capsule, Take 1 capsule (40 mg total) by mouth 2 (two) times daily., Disp: 90 capsule, Rfl: 1 ?  gabapentin (NEURONTIN) 600 MG tablet, Take 1 tablet (600 mg total) by mouth 3 (three) times daily., Disp: 90 tablet, Rfl: 1 ?  lisinopril-hydrochlorothiazide (ZESTORETIC) 20-25 MG tablet, Take 1 tablet by mouth daily., Disp: 30 tablet, Rfl: 2 ?  mupirocin ointment (BACTROBAN) 2 %, Apply to affected area twice a day for 7 days., Disp: 22 g, Rfl: 3 ?  omeprazole (PRILOSEC) 20 MG capsule, Take 1 capsule (20 mg total) by mouth 2 (two) times daily before a meal., Disp: 60 capsule, Rfl: 2 ?  Oxcarbazepine (TRILEPTAL) 300 MG tablet, Take 600 mg by mouth daily., Disp: , Rfl:  ?  sulfamethoxazole-trimethoprim (BACTRIM DS) 800-160 MG tablet, Take 1 tablet by mouth 2 (two) times daily., Disp: 6 tablet, Rfl: 0 ?  traZODone (DESYREL) 150 MG tablet, Take 1 tablet (150 mg total) by mouth at bedtime., Disp: 90 tablet, Rfl: 3 ?  Vitamin D, Ergocalciferol, (DRISDOL) 1.25 MG (50000 UNIT) CAPS capsule, Take 1 capsule (50,000 Units total) by mouth every 7 (seven) days. Take for 8 total doses(weeks), Disp: 12 capsule, Rfl: 1  ? ?No Known Allergies ? ?Past Medical History:  ?Diagnosis Date  ? Bipolar 1 disorder (HCC)   ?  Hypertension   ?  ? ?Past Surgical History:  ?Procedure Laterality Date  ? LEEP  2006  ? ? ?Family History  ?Problem Relation Age of Onset  ? Healthy Mother   ? Healthy Father   ? ? ?Social History  ? ?Tobacco Use  ? Smoking status: Every Day  ?  Packs/day: 1.00  ?  Types: Cigarettes  ? Smokeless tobacco: Never  ?Vaping Use  ? Vaping Use: Never used  ?Substance Use Topics  ? Alcohol use: No  ? Drug use: No  ? ? ?ROS ? ? ?Objective:  ? ?Vitals: ?BP 130/77 (BP Location: Right Arm)   Pulse 82   Temp 97.6 ?F (36.4 ?C) (Oral)   Resp 18   LMP  (Within Weeks) Comment: 2 weeks  SpO2 98%  ? ?Physical Exam ?Constitutional:   ?   General: She is not in acute distress. ?   Appearance: Normal appearance. She is well-developed. She is not ill-appearing, toxic-appearing or diaphoretic.  ?HENT:  ?   Head: Normocephalic and atraumatic.  ?   Right Ear: External ear normal.  ?   Left Ear: External ear normal.  ?   Nose: Rhinorrhea present. No congestion.  ?   Mouth/Throat:  ?   Mouth: Mucous membranes are moist.  ?   Pharynx: No oropharyngeal exudate or posterior oropharyngeal erythema.  ?Eyes:  ?   General: No scleral icterus.    ?  Right eye: No discharge.     ?   Left eye: No discharge.  ?   Extraocular Movements: Extraocular movements intact.  ?Cardiovascular:  ?   Rate and Rhythm: Normal rate.  ?   Heart sounds: No murmur heard. ?  No friction rub. No gallop.  ?Pulmonary:  ?   Effort: Pulmonary effort is normal. No respiratory distress.  ?   Breath sounds: No stridor. Wheezing and rhonchi present. No rales.  ?Chest:  ?   Chest wall: No tenderness.  ?Skin: ?   General: Skin is warm and dry.  ?Neurological:  ?   General: No focal deficit present.  ?   Mental Status: She is alert and oriented to person, place, and time.  ?Psychiatric:     ?   Mood and Affect: Mood normal.     ?   Behavior: Behavior normal.  ? ?DG Chest 2 View ? ?Result Date: 08/23/2021 ?CLINICAL DATA:  Cough and congestion. EXAM: CHEST - 2 VIEW COMPARISON:   Chest x-ray 01/13/2017. FINDINGS: The heart size and mediastinal contours are within normal limits. Both lungs are clear. The visualized skeletal structures are unremarkable. IMPRESSION: No active cardiopulmonary disease. Electronically Signed   By: Darliss Cheney M.D.   On: 08/23/2021 15:47   ? ?Assessment and Plan :  ? ?PDMP not reviewed this encounter. ? ?1. Acute bronchitis, unspecified organism   ?2. Rhonchi   ?3. Wheezing   ?4. Smoker   ? ?We will manage for viral bronchitis with supportive care.  In the context of her pulmonary exam and respiratory symptoms, recommended an oral prednisone course following the IM Solu-Medrol given in clinic. Use albuterol inhaler as needed. Counseled patient on potential for adverse effects with medications prescribed/recommended today, ER and return-to-clinic precautions discussed, patient verbalized understanding. ? ?  ?Wallis Bamberg, PA-C ?08/23/21 1553 ? ?

## 2021-08-23 NOTE — ED Triage Notes (Signed)
Pt reports cough, runny nose, chills, fatigue x 1 day;  fluid in chest x 2-3 months. States feel like she will develop pneumonia. Mucinex gives some relief.  ?

## 2021-08-24 ENCOUNTER — Encounter (HOSPITAL_BASED_OUTPATIENT_CLINIC_OR_DEPARTMENT_OTHER): Payer: Self-pay

## 2021-08-25 ENCOUNTER — Other Ambulatory Visit (HOSPITAL_BASED_OUTPATIENT_CLINIC_OR_DEPARTMENT_OTHER): Payer: Self-pay | Admitting: Nurse Practitioner

## 2021-08-29 ENCOUNTER — Ambulatory Visit (INDEPENDENT_AMBULATORY_CARE_PROVIDER_SITE_OTHER): Payer: 59 | Admitting: Nurse Practitioner

## 2021-08-29 ENCOUNTER — Other Ambulatory Visit: Payer: Self-pay

## 2021-08-29 ENCOUNTER — Encounter (HOSPITAL_BASED_OUTPATIENT_CLINIC_OR_DEPARTMENT_OTHER): Payer: Self-pay | Admitting: Nurse Practitioner

## 2021-08-29 VITALS — Ht 66.0 in | Wt 220.0 lb

## 2021-08-29 DIAGNOSIS — F4321 Adjustment disorder with depressed mood: Secondary | ICD-10-CM

## 2021-08-29 DIAGNOSIS — Z62820 Parent-biological child conflict: Secondary | ICD-10-CM | POA: Insufficient documentation

## 2021-08-29 DIAGNOSIS — Z635 Disruption of family by separation and divorce: Secondary | ICD-10-CM | POA: Insufficient documentation

## 2021-08-29 DIAGNOSIS — Z634 Disappearance and death of family member: Secondary | ICD-10-CM | POA: Insufficient documentation

## 2021-08-29 DIAGNOSIS — F319 Bipolar disorder, unspecified: Secondary | ICD-10-CM

## 2021-08-29 DIAGNOSIS — F4320 Adjustment disorder, unspecified: Secondary | ICD-10-CM

## 2021-08-29 DIAGNOSIS — F411 Generalized anxiety disorder: Secondary | ICD-10-CM | POA: Insufficient documentation

## 2021-08-29 DIAGNOSIS — J0140 Acute pansinusitis, unspecified: Secondary | ICD-10-CM

## 2021-08-29 DIAGNOSIS — Z6835 Body mass index (BMI) 35.0-35.9, adult: Secondary | ICD-10-CM

## 2021-08-29 DIAGNOSIS — F331 Major depressive disorder, recurrent, moderate: Secondary | ICD-10-CM | POA: Insufficient documentation

## 2021-08-29 DIAGNOSIS — J302 Other seasonal allergic rhinitis: Secondary | ICD-10-CM | POA: Insufficient documentation

## 2021-08-29 DIAGNOSIS — R569 Unspecified convulsions: Secondary | ICD-10-CM

## 2021-08-29 DIAGNOSIS — E785 Hyperlipidemia, unspecified: Secondary | ICD-10-CM | POA: Insufficient documentation

## 2021-08-29 DIAGNOSIS — Z569 Unspecified problems related to employment: Secondary | ICD-10-CM | POA: Insufficient documentation

## 2021-08-29 DIAGNOSIS — G479 Sleep disorder, unspecified: Secondary | ICD-10-CM | POA: Insufficient documentation

## 2021-08-29 DIAGNOSIS — K219 Gastro-esophageal reflux disease without esophagitis: Secondary | ICD-10-CM | POA: Insufficient documentation

## 2021-08-29 DIAGNOSIS — Z720 Tobacco use: Secondary | ICD-10-CM | POA: Insufficient documentation

## 2021-08-29 DIAGNOSIS — F419 Anxiety disorder, unspecified: Secondary | ICD-10-CM | POA: Insufficient documentation

## 2021-08-29 HISTORY — DX: Unspecified problems related to employment: Z56.9

## 2021-08-29 HISTORY — DX: Adjustment disorder, unspecified: F43.20

## 2021-08-29 HISTORY — DX: Disappearance and death of family member: Z63.4

## 2021-08-29 HISTORY — DX: Adjustment disorder with depressed mood: F43.21

## 2021-08-29 HISTORY — DX: Body mass index (BMI) 35.0-35.9, adult: Z68.35

## 2021-08-29 HISTORY — DX: Acute pansinusitis, unspecified: J01.40

## 2021-08-29 HISTORY — DX: Disruption of family by separation and divorce: Z63.5

## 2021-08-29 MED ORDER — OXCARBAZEPINE 300 MG PO TABS: 600.0000 mg | ORAL_TABLET | Freq: Every day | ORAL | 3 refills | Status: DC

## 2021-08-29 MED ORDER — FLUCONAZOLE 150 MG PO TABS: 150.0000 mg | ORAL_TABLET | Freq: Once | ORAL | 1 refills | Status: AC

## 2021-08-29 MED ORDER — AMOXICILLIN-POT CLAVULANATE 875-125 MG PO TABS: 1.0000 | ORAL_TABLET | Freq: Two times a day (BID) | ORAL | 0 refills | Status: DC

## 2021-08-29 MED ORDER — CLONAZEPAM 0.5 MG PO TABS: 0.5000 mg | ORAL_TABLET | Freq: Two times a day (BID) | ORAL | 1 refills | Status: DC | PRN

## 2021-08-29 NOTE — Progress Notes (Signed)
Virtual Visit Encounter telephone visit. ? ? ?I connected with  [name] on 08/29/21 at 10:50 AM EDT by secure audio telemedicine application. I verified that I am speaking with the correct person using two identifiers. ?  ?I introduced myself as a Designer, jewellery with the practice. The limitations of evaluation and management by telemedicine discussed with the patient and the availability of in person appointments. The patient expressed verbal understanding and consent to proceed. ? ?Participating parties in this visit include: Myself and patient ? ?The patient is: Patient Location: Home ?I am: Provider Location: Office/Clinic ?Subjective:   ? ?CC and HPI: Sarah Espinoza is a 46 y.o. year old female presenting for new evaluation and treatment of URI symptoms. ? ?She tells me she has a cough, rhinorrhea, and congestion that started a few weeks ago. She tells me she thought it was allergies and she tried to wait it out, but it started to get worse so she went to the UC last week. She was given cough syrup, steroids, and allergy medication. She tells me that since starting this her symptoms have progressively worsened and now she feels like she has an infection. She denies fevers, but is having chills. She also endorses cough (productive), sinus pain and pressure, tooth pain, rhinorrhea, and fatigue.  ? ?She also needs refills on her chronic medications. ? ?Past medical history, Surgical history, Family history not pertinant except as noted below, Social history, Allergies, and medications have been entered into the medical record, reviewed, and corrections made.  ? ?Review of Systems:  ?All review of systems negative except what is listed in the HPI ? ?Objective:   ? ?Alert and oriented x 4 ?Speaking in clear sentences with no shortness of breath. ?No distress. ? ?Impression and Recommendations:   ? ?Problem List Items Addressed This Visit   ? ? Acute non-recurrent pansinusitis - Primary  ?  Symptoms  consistent with sinusitis. Given length of time symptoms have been present, will start treatment today with Augmentin. Diflucan provided for yeast infection that patient often gets with antibiotics.  ?Recommend rest, increased hydration, and Mucinex to help with cough and congestion.  ?If no improvement in the next week, recommend follow-up appointment to evaluate further in person.  ?  ?  ? Relevant Medications  ? amoxicillin-clavulanate (AUGMENTIN) 875-125 MG tablet  ? fluconazole (DIFLUCAN) 150 MG tablet  ? Seizure ([place])  ? Relevant Medications  ? clonazePAM (KLONOPIN) 0.5 MG tablet  ? Oxcarbazepine (TRILEPTAL) 300 MG tablet  ? Bipolar disorder ([place])  ? Relevant Medications  ? clonazePAM (KLONOPIN) 0.5 MG tablet  ? Oxcarbazepine (TRILEPTAL) 300 MG tablet  ? ? ?orders and follow up as documented in EMR ?I discussed the assessment and treatment plan with the patient. The patient was provided an opportunity to ask questions and all were answered. The patient agreed with the plan and demonstrated an understanding of the instructions. ?  ?The patient was advised to call back or seek an in-person evaluation if the symptoms worsen or if the condition fails to improve as anticipated. ? ?Follow-Up: prn ? ?I provided 18 minutes of non-face-to-face interaction with this non face-to-face encounter including intake, same-day documentation, and chart review.  ? ?Orma Render, NP , DNP, AGNP-c ?[facility] ?Primary Care & Sports Medicine at [facility] ?[phone] ?[phone] (fax) ? ?

## 2021-08-29 NOTE — Assessment & Plan Note (Signed)
Symptoms consistent with sinusitis. Given length of time symptoms have been present, will start treatment today with Augmentin. Diflucan provided for yeast infection that patient often gets with antibiotics.  ?Recommend rest, increased hydration, and Mucinex to help with cough and congestion.  ?If no improvement in the next week, recommend follow-up appointment to evaluate further in person.  ?

## 2021-08-29 NOTE — Patient Instructions (Addendum)
Let me know if you aren't feeling any better in the next few days.  ?

## 2021-09-11 ENCOUNTER — Other Ambulatory Visit (HOSPITAL_BASED_OUTPATIENT_CLINIC_OR_DEPARTMENT_OTHER): Payer: Self-pay | Admitting: Nurse Practitioner

## 2021-10-09 ENCOUNTER — Other Ambulatory Visit (HOSPITAL_BASED_OUTPATIENT_CLINIC_OR_DEPARTMENT_OTHER): Payer: Self-pay | Admitting: Nurse Practitioner

## 2021-11-07 ENCOUNTER — Other Ambulatory Visit (HOSPITAL_BASED_OUTPATIENT_CLINIC_OR_DEPARTMENT_OTHER): Payer: Self-pay | Admitting: Nurse Practitioner

## 2021-11-07 DIAGNOSIS — F319 Bipolar disorder, unspecified: Secondary | ICD-10-CM

## 2021-11-08 ENCOUNTER — Other Ambulatory Visit (HOSPITAL_BASED_OUTPATIENT_CLINIC_OR_DEPARTMENT_OTHER): Payer: Self-pay | Admitting: Nurse Practitioner

## 2021-11-13 ENCOUNTER — Ambulatory Visit
Admission: EM | Admit: 2021-11-13 | Discharge: 2021-11-13 | Disposition: A | Discharge status: Home / Self Care | Payer: 59 | Attending: Nurse Practitioner | Admitting: Nurse Practitioner

## 2021-11-13 ENCOUNTER — Other Ambulatory Visit (HOSPITAL_BASED_OUTPATIENT_CLINIC_OR_DEPARTMENT_OTHER): Payer: Self-pay | Admitting: Nurse Practitioner

## 2021-11-13 DIAGNOSIS — B372 Candidiasis of skin and nail: Secondary | ICD-10-CM | POA: Insufficient documentation

## 2021-11-13 DIAGNOSIS — L03316 Cellulitis of umbilicus: Secondary | ICD-10-CM | POA: Insufficient documentation

## 2021-11-13 DIAGNOSIS — N898 Other specified noninflammatory disorders of vagina: Secondary | ICD-10-CM | POA: Diagnosis present

## 2021-11-13 MED ORDER — CEPHALEXIN 500 MG PO CAPS: 500.0000 mg | ORAL_CAPSULE | Freq: Four times a day (QID) | ORAL | 0 refills | Status: DC

## 2021-11-13 MED ORDER — MUPIROCIN 2 % EX OINT: 1.0000 "application " | TOPICAL_OINTMENT | Freq: Two times a day (BID) | CUTANEOUS | 0 refills | Status: DC

## 2021-11-13 MED ORDER — NYSTATIN 100000 UNIT/GM EX CREA: TOPICAL_CREAM | CUTANEOUS | 0 refills | Status: DC

## 2021-11-13 NOTE — ED Provider Notes (Signed)
[facility]    CSN: [id number] Arrival date & time: 11/13/21  1243      History   Chief Complaint Chief Complaint  Patient presents with   infection     Belly button infected    HPI Sarah Espinoza is a 46 y.o. female.   Patient presents today with concern of infection in her bellybutton.  She reports every couple of weeks, she notices drainage and a foul order coming from her bellybutton.  Reports that she "cleans it out" and it gets better temporarily, however then recurs.  She reports last night, she noticed the odor and drainage return.  She also has felt sick on her stomach.  She denies fevers, bodyaches, chills, vomiting.  Also reports she has a "yeast infection."  She endorses vaginal itching and discharge for the past few days; she also has a red itchy rash in between the folds of her legs bilaterally that gets worse in the summertime.  She is sexually active with 1 female partner.    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/29/2021   Other seasonal allergic rhinitis 08/29/2021   Parent/child conflict 08/29/2021   Sleep disturbance 08/29/2021   Tobacco abuse 08/29/2021   Acute non-recurrent pansinusitis 08/29/2021   Seizure (HCC) 08/29/2021   Vitamin D deficiency 07/04/2021   Hypothyroidism 07/04/2021   Bipolar disorder (HCC) 07/04/2021   Primary hypertension 07/04/2021   Paronychia of left index finger 07/04/2021   Encounter for screening for diabetes  mellitus 07/04/2021   Heart murmur 07/04/2021   Intermittent palpitations 07/04/2021    Past Surgical History:  Procedure Laterality Date   LEEP  2006    OB History   No obstetric history on file.      Home Medications    Prior to Admission medications   Medication Sig Start Date End Date Taking? Authorizing Provider  cephALEXin (KEFLEX) 500 MG capsule Take 1 capsule (500 mg total) by mouth 4 (four) times daily. 11/13/21  Yes Valentino NoseMartinez, Estefania Kamiya A, NP  mupirocin ointment (BACTROBAN) 2 % Apply 1 application  topically 2 (two) times daily. Apply to belly button 11/13/21  Yes Cathlean MarseillesMartinez, Tailey Top A, NP  nystatin cream (MYCOSTATIN) Apply to affected area 2 times daily 11/13/21  Yes Valentino NoseMartinez, Grasiela Jonsson A, NP  albuterol (VENTOLIN HFA) 108 (90 Base) MCG/ACT inhaler Inhale 1-2 puffs into the lungs every 6 (six) hours as needed for wheezing or shortness of breath. 08/23/21   Wallis BambergMani, Mario, PA-C  clonazePAM (KLONOPIN) 0.5 MG tablet Take 1 tablet by mouth twice daily as needed 11/08/21   Early, Sung AmabileSara E, NP  FLUoxetine (PROZAC) 40 MG capsule Take 1 capsule by mouth twice daily 11/13/21   Early, Sung AmabileSara E, NP  gabapentin (NEURONTIN) 600 MG tablet TAKE 1 TABLET BY MOUTH THREE TIMES DAILY 11/08/21   Early, Sung AmabileSara E, NP  lisinopril-hydrochlorothiazide (ZESTORETIC) 20-25 MG tablet Take 1 tablet by mouth daily. 01/05/19   Wurst, GrenadaBrittany, PA-C  Oxcarbazepine (TRILEPTAL) 300 MG tablet Take 2 tablets (600  mg total) by mouth daily. 08/29/21   [name], NP  traZODone (DESYREL) 150 MG tablet Take 1 tablet (150 mg total) by mouth at bedtime. 08/21/21   [name], NP  Vitamin D, Ergocalciferol, (DRISDOL) 1.25 MG (50000 UNIT) CAPS capsule Take 1 capsule (50,000 Units total) by mouth every 7 (seven) days. Take for 8 total doses(weeks) 07/04/21   Early, Sung Amabile, NP  doxepin (SINEQUAN) 25 MG capsule Take 25 mg by mouth at bedtime. 10/07/14 11/24/18  [provider]  propranolol (INNOPRAN XL) 120 MG 24 hr capsule Take 120 mg by  mouth at bedtime.  11/24/18  [provider]    Family History Family History  Problem Relation Age of Onset   Healthy Mother    Healthy Father     Social History Social History   Tobacco Use   Smoking status: Every Day    Packs/day: 1.00    Types: Cigarettes   Smokeless tobacco: Never  Vaping Use   Vaping Use: Never used  Substance Use Topics   Alcohol use: No   Drug use: No     Allergies   Patient has no known allergies.   Review of Systems Review of Systems Per HPI  Physical Exam Triage Vital Signs ED Triage Vitals  Enc Vitals Group     BP 11/13/21 1254 105/75     Pulse Rate 11/13/21 1254 78     Resp 11/13/21 1254 20     Temp 11/13/21 1254 98.7 F (37.1 C)     Temp Source 11/13/21 1254 Oral     SpO2 11/13/21 1254 97 %     Weight --      Height --      Head Circumference --      Peak Flow --      Pain Score 11/13/21 1251 0     Pain Loc --      Pain Edu? --      Excl. in GC? --    No data found.  Updated Vital Signs BP 105/75 (BP Location: Right Arm)   Pulse 78   Temp 98.7 F (37.1 C) (Oral)   Resp 20   LMP 10/29/2021 (Approximate)   SpO2 97%   Visual Acuity Right Eye Distance:   Left Eye Distance:   Bilateral Distance:    Right Eye Near:   Left Eye Near:    Bilateral Near:     Physical Exam Vitals and nursing note reviewed.  Constitutional:      General: She is not in acute distress.    Appearance: Normal appearance. She is not toxic-appearing.  HENT:     Head: Normocephalic and atraumatic.     Mouth/Throat:     Mouth: Mucous membranes are moist.     Pharynx: Oropharynx is clear.  Pulmonary:     Effort: Pulmonary effort is normal. No respiratory distress.  Abdominal:     General: Abdomen is flat. Bowel sounds are normal.     Palpations: Abdomen is soft.  Genitourinary:    Comments: Deferred - self swab performed Skin:    General: Skin is warm and dry.     Capillary Refill: Capillary refill takes less than 2  seconds.     Coloration: Skin is not jaundiced or pale.     Findings: Erythema present.          Comments: Erythematous, macerated rash in between skin folds in area marked; no obvious drainage.  No drainage, redness from umbilicus;  it is tender to palpation  Neurological:     Mental Status: She is alert and oriented to person, place, and time.  Psychiatric:        Behavior: Behavior is cooperative.      UC Treatments / Results  Labs (all labs ordered are listed, but only abnormal results are displayed) Labs Reviewed  CERVICOVAGINAL ANCILLARY ONLY    EKG   Radiology No results found.  Procedures Procedures (including critical care time)  Medications Ordered in UC Medications - No data to display  Initial Impression / Assessment and Plan / UC Course  I have reviewed the triage vital signs and the nursing notes.  Pertinent labs & imaging results that were available during my care of the patient were reviewed by me and considered in my medical decision making (see chart for details).    We will cover patient for cellulitis of the umbilicus with Keflex and antibiotic ointment Bactroban.  Encourage close follow-up with primary care if this recurs despite treatment.  Rash in groin appears to be intertrigo dermatitis-treat with nystatin cream.  Also obtained self swab to check for yeast vaginitis, bacterial vaginosis, gonorrhea, chlamydia, trichomonas.  Treat as indicated. Final Clinical Impressions(s) / UC Diagnoses   Final diagnoses:  Cellulitis of umbilicus  Vaginal discharge  Candidal intertrigo     Discharge Instructions      - Please start the Keflex (antibiotic) and antibiotic ointment to help clear up the infection around your belly button - You can use the nystatin cream in between the folds that are irritated - We are testing the vaginal swab and will let you know if this comes back showing a yeast infection  -Please follow-up with your primary care  provider to discuss long-term treatment of the bellybutton infection    ED Prescriptions     Medication Sig Dispense Auth. Provider   nystatin cream (MYCOSTATIN) Apply to affected area 2 times daily 30 g Cathlean Marseilles A, NP   mupirocin ointment (BACTROBAN) 2 % Apply 1 application  topically 2 (two) times daily. Apply to belly button 22 g Cathlean Marseilles A, NP   cephALEXin (KEFLEX) 500 MG capsule Take 1 capsule (500 mg total) by mouth 4 (four) times daily. 20 capsule Valentino Nose, NP      I have reviewed the PDMP during this encounter.   Valentino Nose, NP 11/13/21 1344

## 2021-11-13 NOTE — ED Triage Notes (Signed)
Pt states that her belly button has been infected off and on for years  Pt states that it is becoming infected every 2 weeks  Pt states that she woke up last weekend and there was a ring of dried blood and a foul smell coming from it   Pt states she also thinks she has a yeast infection in her vagina and in her folds of her groin  Pt states that she has been feeling nauseated

## 2021-11-13 NOTE — Discharge Instructions (Signed)
-   Please start the Keflex (antibiotic) and antibiotic ointment to help clear up the infection around your belly button - You can use the nystatin cream in between the folds that are irritated - We are testing the vaginal swab and will let you know if this comes back showing a yeast infection  -Please follow-up with your primary care provider to discuss long-term treatment of the bellybutton infection

## 2021-11-14 ENCOUNTER — Telehealth (HOSPITAL_COMMUNITY): Payer: Self-pay | Admitting: Emergency Medicine

## 2021-11-14 LAB — CERVICOVAGINAL ANCILLARY ONLY
Bacterial Vaginitis (gardnerella): NEGATIVE
Candida Glabrata: NEGATIVE
Candida Vaginitis: POSITIVE — AB
Chlamydia: NEGATIVE
Comment: NEGATIVE
Comment: NEGATIVE
Comment: NEGATIVE
Comment: NEGATIVE
Comment: NEGATIVE
Comment: NORMAL
Neisseria Gonorrhea: NEGATIVE
Trichomonas: NEGATIVE

## 2021-11-14 MED ORDER — FLUCONAZOLE 150 MG PO TABS: 150.0000 mg | ORAL_TABLET | Freq: Once | ORAL | 0 refills | Status: AC

## 2021-11-29 ENCOUNTER — Other Ambulatory Visit (HOSPITAL_BASED_OUTPATIENT_CLINIC_OR_DEPARTMENT_OTHER): Payer: Self-pay | Admitting: Nurse Practitioner

## 2021-11-29 DIAGNOSIS — F331 Major depressive disorder, recurrent, moderate: Secondary | ICD-10-CM

## 2021-12-07 ENCOUNTER — Other Ambulatory Visit (HOSPITAL_BASED_OUTPATIENT_CLINIC_OR_DEPARTMENT_OTHER): Payer: Self-pay | Admitting: Nurse Practitioner

## 2021-12-09 ENCOUNTER — Other Ambulatory Visit (HOSPITAL_BASED_OUTPATIENT_CLINIC_OR_DEPARTMENT_OTHER): Payer: Self-pay | Admitting: Nurse Practitioner

## 2021-12-11 ENCOUNTER — Other Ambulatory Visit (HOSPITAL_BASED_OUTPATIENT_CLINIC_OR_DEPARTMENT_OTHER): Payer: Self-pay | Admitting: Nurse Practitioner

## 2021-12-11 ENCOUNTER — Telehealth (HOSPITAL_BASED_OUTPATIENT_CLINIC_OR_DEPARTMENT_OTHER): Payer: Self-pay

## 2021-12-11 DIAGNOSIS — F419 Anxiety disorder, unspecified: Secondary | ICD-10-CM

## 2021-12-11 DIAGNOSIS — F4320 Adjustment disorder, unspecified: Secondary | ICD-10-CM

## 2021-12-11 DIAGNOSIS — F319 Bipolar disorder, unspecified: Secondary | ICD-10-CM

## 2021-12-11 DIAGNOSIS — R569 Unspecified convulsions: Secondary | ICD-10-CM

## 2021-12-11 NOTE — Telephone Encounter (Signed)
[facility] called and left message on nurse line stating CMA confirmed prescriptions were sent to pharmacy and they do not have it. The prescriptions are for fluoxetine and gabapentin

## 2021-12-12 ENCOUNTER — Other Ambulatory Visit (HOSPITAL_BASED_OUTPATIENT_CLINIC_OR_DEPARTMENT_OTHER): Payer: Self-pay

## 2021-12-12 MED ORDER — GABAPENTIN 600 MG PO TABS: 600.0000 mg | ORAL_TABLET | Freq: Three times a day (TID) | ORAL | 0 refills | Status: DC

## 2021-12-12 MED ORDER — GABAPENTIN 600 MG PO TABS: 600.0000 mg | ORAL_TABLET | Freq: Three times a day (TID) | ORAL | 3 refills | Status: DC

## 2021-12-12 MED ORDER — FLUOXETINE HCL 40 MG PO CAPS: 40.0000 mg | ORAL_CAPSULE | Freq: Two times a day (BID) | ORAL | 3 refills | Status: DC

## 2021-12-12 NOTE — Telephone Encounter (Signed)
Refills sent to [facility] pharmacy in [place]  

## 2022-03-27 ENCOUNTER — Other Ambulatory Visit (HOSPITAL_BASED_OUTPATIENT_CLINIC_OR_DEPARTMENT_OTHER): Payer: Self-pay | Admitting: Nurse Practitioner

## 2022-03-27 DIAGNOSIS — F319 Bipolar disorder, unspecified: Secondary | ICD-10-CM

## 2022-04-03 ENCOUNTER — Encounter (HOSPITAL_BASED_OUTPATIENT_CLINIC_OR_DEPARTMENT_OTHER): Payer: Self-pay | Admitting: Nurse Practitioner

## 2022-04-05 ENCOUNTER — Encounter (HOSPITAL_BASED_OUTPATIENT_CLINIC_OR_DEPARTMENT_OTHER): Payer: Self-pay | Admitting: Nurse Practitioner

## 2022-04-05 ENCOUNTER — Ambulatory Visit (INDEPENDENT_AMBULATORY_CARE_PROVIDER_SITE_OTHER): Payer: 59 | Admitting: Nurse Practitioner

## 2022-04-05 VITALS — BP 124/52 | HR 72 | Ht 66.0 in | Wt 196.0 lb

## 2022-04-05 DIAGNOSIS — R110 Nausea: Secondary | ICD-10-CM | POA: Diagnosis not present

## 2022-04-05 DIAGNOSIS — F411 Generalized anxiety disorder: Secondary | ICD-10-CM | POA: Diagnosis not present

## 2022-04-05 DIAGNOSIS — R69 Illness, unspecified: Secondary | ICD-10-CM | POA: Diagnosis not present

## 2022-04-05 DIAGNOSIS — I10 Essential (primary) hypertension: Secondary | ICD-10-CM | POA: Diagnosis not present

## 2022-04-05 DIAGNOSIS — F319 Bipolar disorder, unspecified: Secondary | ICD-10-CM | POA: Diagnosis not present

## 2022-04-05 DIAGNOSIS — F419 Anxiety disorder, unspecified: Secondary | ICD-10-CM

## 2022-04-05 DIAGNOSIS — F331 Major depressive disorder, recurrent, moderate: Secondary | ICD-10-CM | POA: Diagnosis not present

## 2022-04-05 MED ORDER — ONDANSETRON HCL 4 MG PO TABS: 4.0000 mg | ORAL_TABLET | Freq: Three times a day (TID) | ORAL | 2 refills | Status: DC | PRN

## 2022-04-05 MED ORDER — LISINOPRIL-HYDROCHLOROTHIAZIDE 20-25 MG PO TABS: 1.0000 | ORAL_TABLET | Freq: Every day | ORAL | 3 refills | Status: DC

## 2022-04-05 MED ORDER — CLONAZEPAM 0.5 MG PO TABS: 0.5000 mg | ORAL_TABLET | Freq: Two times a day (BID) | ORAL | 3 refills | Status: DC | PRN

## 2022-04-05 MED ORDER — OXCARBAZEPINE 300 MG PO TABS: 600.0000 mg | ORAL_TABLET | Freq: Every day | ORAL | 3 refills | Status: AC

## 2022-04-05 MED ORDER — FLUOXETINE HCL 40 MG PO CAPS: 40.0000 mg | ORAL_CAPSULE | Freq: Two times a day (BID) | ORAL | 3 refills | Status: DC

## 2022-04-05 MED ORDER — GABAPENTIN 600 MG PO TABS: 600.0000 mg | ORAL_TABLET | Freq: Three times a day (TID) | ORAL | 3 refills | Status: DC

## 2022-04-05 NOTE — Patient Instructions (Addendum)
I have sent all of your medication refills in for you. If you have any issues, please do not hesitate to let us know.   We will need to follow-up in about 6 months.   I will let you know what your labs show and if we need to make changes we can discuss this.   It was so good to see you!! I hope you have a great week.

## 2022-04-05 NOTE — Progress Notes (Signed)
Sarah Keeler, DNP, AGNP-c [address] [place], [place] [address] [phone] Office [phone] Fax  ESTABLISHED PATIENT- Chronic Health and/or Follow-Up Visit  Blood pressure (!) 124/52, pulse 72, height _0  (1.676 m), weight 196 lb (88.9 kg), SpO2 99 %.  Anxiety/Depression Anxiety, Follow-up  She was last seen for anxiety 10 months ago. Changes made at last visit include none.   She reports good compliance with treatment. She reports good to fair tolerance of treatment. She is not having side effects.   She feels her anxiety is Mild to moderate and Improved since last visit.  Symptoms: No chest pain No difficulty concentrating  No dizziness Yes fatigue  No feelings of losing control No insomnia  No irritable No palpitations  Yes panic attacks No racing thoughts  No shortness of breath No sweating  No tremors/shakes    GAD-7 Results    04/05/2022   10:03 AM  GAD-7 Generalized Anxiety Disorder Screening Tool  1. Feeling Nervous, Anxious, or on Edge 3  2. Not Being Able to Stop or Control Worrying 3  3. Worrying Too Much About Different Things 3  4. Trouble Relaxing 1  5. Being So Restless it's Hard To Sit Still 1  6. Becoming Easily Annoyed or Irritable 1  7. Feeling Afraid As If Something Awful Might Happen 1  Total GAD-7 Score 13  Difficulty At Work, Home, or Getting  Along With Others? Not difficult at all    PHQ-9 Scores    04/05/2022   10:02 AM 08/29/2021   11:12 AM 07/04/2021   12:37 PM  PHQ9 SCORE ONLY  PHQ-9 Total Score _1 ---------------------------------------------------------------------------------------------------  HTN Hypertension, follow-up  BP Readings from Last 3 Encounters:  04/05/22 (!) 124/52  11/13/21 105/75  08/23/21 130/77   Wt Readings from Last 3 Encounters:  04/05/22 196 lb (88.9 kg)  08/29/21 220 lb (99.8 kg)  07/04/21 226 lb (102.5 kg)     She was  last seen for hypertension 10 months ago.  BP at that visit was controlled. Management since that visit includes none.  She reports good compliance with treatment. She is not having side effects.  She is following a Regular diet. She is not exercising. She does smoke.  Use of agents associated with hypertension: none.   Outside blood pressures are none. Symptoms: No chest pain No chest pressure  No palpitations No syncope  No dyspnea No orthopnea  No paroxysmal nocturnal dyspnea No lower extremity edema   Pertinent labs Lab Results  Component Value Date   CHOL 198 04/05/2022   HDL 60 04/05/2022   LDLCALC 125 (H) 04/05/2022   TRIG 72 04/05/2022   CHOLHDL 3.3 04/05/2022   Lab Results  Component Value Date   NA 133 (L) 04/05/2022   K 4.6 04/05/2022   CREATININE 0.63 04/05/2022   EGFR 112 04/05/2022   GLUCOSE 82 04/05/2022   TSH 1.680 04/05/2022     The 10-year ASCVD risk score (Arnett DK, et al., 2019) is: 2.8%  ---------------------------------------------------------------------------------------------------  All ROS negative with exception of what is listed above.   PHYSICAL EXAM Physical Exam Vitals and nursing note reviewed.  Constitutional:      General: She is not in acute distress.    Appearance: Normal appearance.  HENT:     Head: Normocephalic.  Eyes:     Extraocular Movements: Extraocular movements intact.     Conjunctiva/sclera: Conjunctivae normal.  Pupils: Pupils are equal, round, and reactive to light.  Neck:     Vascular: No carotid bruit.  Cardiovascular:     Rate and Rhythm: Normal rate and regular rhythm.     Pulses: Normal pulses.     Heart sounds: Normal heart sounds. No murmur heard. Pulmonary:     Effort: Pulmonary effort is normal.     Breath sounds: Normal breath sounds. No wheezing.  Abdominal:     General: Bowel sounds are normal. There is no distension.     Palpations: Abdomen is soft.     Tenderness: There is no abdominal  tenderness. There is no guarding.  Musculoskeletal:        General: Normal range of motion.     Cervical back: Normal range of motion and neck supple.     Right lower leg: No edema.     Left lower leg: No edema.  Lymphadenopathy:     Cervical: No cervical adenopathy.  Skin:    General: Skin is warm and dry.     Capillary Refill: Capillary refill takes less than 2 seconds.  Neurological:     General: No focal deficit present.     Mental Status: She is alert and oriented to person, place, and time.  Psychiatric:        Mood and Affect: Mood normal.        Behavior: Behavior normal.        Thought Content: Thought content normal.        Judgment: Judgment normal.     PLAN Problem List Items Addressed This Visit     Bipolar disorder ([place])    Chronic.  Well-managed on current time.  No changes to plan of care.      Relevant Medications   clonazePAM (KLONOPIN) 0.5 MG tablet   FLUoxetine (PROZAC) 40 MG capsule   Oxcarbazepine (TRILEPTAL) 300 MG tablet   Primary hypertension    Time.  NoChronic.  Well-controlled with symptoms are present.  Continue current medication regimen.  Labs pending.  Plan to follow-up in 6 months or sooner if needed.      Relevant Medications   lisinopril-hydrochlorothiazide (ZESTORETIC) 20-25 MG tablet   Other Relevant Orders   CBC with Differential/Platelet (Completed)   Comprehensive metabolic panel (Completed)   TSH (Completed)   T4, free (Completed)   VITAMIN D 25 Hydroxy (Vit-D Deficiency, Fractures) (Completed)   Generalized anxiety disorder - Primary    Chronic. Controlled at this time with current regimen. No alarm sx present. Will continue current medication and close monitoring. She is on daily maintenance medication and as needed medication for breakthrough symptoms.  PDMP reviewed today.  Will follow-up in 6 months or sooner if needed      Relevant Medications   FLUoxetine (PROZAC) 40 MG capsule   ondansetron (ZOFRAN) 4 MG tablet    Other Relevant Orders   CBC with Differential/Platelet (Completed)   Comprehensive metabolic panel (Completed)   TSH (Completed)   T4, free (Completed)   VITAMIN D 25 Hydroxy (Vit-D Deficiency, Fractures) (Completed)   Moderate recurrent major depression (HCC)    Chronic.  She is doing well on her current regimen.  Screening questions do indicate positive responses however patient reports that this is her baseline and she denies any concerns with these levels.  We will continue the current regimen and continue close monitoring.  Will plan to follow-up in 6 months or sooner if needed.      Relevant Medications   FLUoxetine (  PROZAC) 40 MG capsule   gabapentin (NEURONTIN) 600 MG tablet   Other Relevant Orders   CBC with Differential/Platelet (Completed)   Comprehensive metabolic panel (Completed)   TSH (Completed)   T4, free (Completed)   VITAMIN D 25 Hydroxy (Vit-D Deficiency, Fractures) (Completed)   Other Visit Diagnoses     Anxiety       Relevant Medications   FLUoxetine (PROZAC) 40 MG capsule   Nausea       Relevant Medications   ondansetron (ZOFRAN) 4 MG tablet       Return in about 6 months (around 10/04/2022) for Will schedule with [place].   Sarah Keeler, DNP, AGNP-c

## 2022-04-06 LAB — CBC WITH DIFFERENTIAL/PLATELET
Basophils Absolute: 0 10*3/uL (ref 0.0–0.2)
Basos: 0 %
EOS (ABSOLUTE): 0.1 10*3/uL (ref 0.0–0.4)
Eos: 1 %
Hematocrit: 34.7 % (ref 34.0–46.6)
Hemoglobin: 11.4 g/dL (ref 11.1–15.9)
Immature Grans (Abs): 0 10*3/uL (ref 0.0–0.1)
Immature Granulocytes: 0 %
Lymphocytes Absolute: 1 10*3/uL (ref 0.7–3.1)
Lymphs: 15 %
MCH: 28.9 pg (ref 26.6–33.0)
MCHC: 32.9 g/dL (ref 31.5–35.7)
MCV: 88 fL (ref 79–97)
Monocytes Absolute: 0.5 10*3/uL (ref 0.1–0.9)
Monocytes: 8 %
Neutrophils Absolute: 5.2 10*3/uL (ref 1.4–7.0)
Neutrophils: 76 %
Platelets: 287 10*3/uL (ref 150–450)
RBC: 3.94 x10E6/uL (ref 3.77–5.28)
RDW: 15.2 % (ref 11.7–15.4)
WBC: 6.9 10*3/uL (ref 3.4–10.8)

## 2022-04-06 LAB — COMPREHENSIVE METABOLIC PANEL
ALT: 20 IU/L (ref 0–32)
AST: 23 IU/L (ref 0–40)
Albumin/Globulin Ratio: 1.8 (ref 1.2–2.2)
Albumin: 3.9 g/dL (ref 3.9–4.9)
Alkaline Phosphatase: 105 IU/L (ref 44–121)
BUN/Creatinine Ratio: 17 (ref 9–23)
BUN: 11 mg/dL (ref 6–24)
Bilirubin Total: <0.2 mg/dL (ref 0.0–1.2)
CO2: 33 mmol/L — ABNORMAL HIGH (ref 20–29)
Calcium: 8.9 mg/dL (ref 8.7–10.2)
Chloride: 98 mmol/L (ref 96–106)
Creatinine, Ser: 0.63 mg/dL (ref 0.57–1.00)
Globulin, Total: 2.2 g/dL (ref 1.5–4.5)
Glucose: 82 mg/dL (ref 70–99)
Potassium: 4.6 mmol/L (ref 3.5–5.2)
Sodium: 133 mmol/L — ABNORMAL LOW (ref 134–144)
Total Protein: 6.1 g/dL (ref 6.0–8.5)
eGFR: 112 mL/min/{1.73_m2} (ref >59)

## 2022-04-06 LAB — LIPID PANEL
Chol/HDL Ratio: 3.3 ratio (ref 0.0–4.4)
Cholesterol, Total: 198 mg/dL (ref 100–199)
HDL: 60 mg/dL (ref >39)
LDL Chol Calc (NIH): 125 mg/dL — ABNORMAL HIGH (ref 0–99)
Triglycerides: 72 mg/dL (ref 0–149)
VLDL Cholesterol Cal: 13 mg/dL (ref 5–40)

## 2022-04-06 LAB — VITAMIN D 25 HYDROXY (VIT D DEFICIENCY, FRACTURES): Vit D, 25-Hydroxy: 41 ng/mL (ref 30.0–100.0)

## 2022-04-06 LAB — T4, FREE: Free T4: 0.68 ng/dL — ABNORMAL LOW (ref 0.82–1.77)

## 2022-04-06 LAB — TSH: TSH: 1.68 u[IU]/mL (ref 0.450–4.500)

## 2022-04-17 ENCOUNTER — Encounter (HOSPITAL_BASED_OUTPATIENT_CLINIC_OR_DEPARTMENT_OTHER): Payer: Self-pay | Admitting: Nurse Practitioner

## 2022-04-20 MED ORDER — CEPHALEXIN 500 MG PO CAPS: 500.0000 mg | ORAL_CAPSULE | Freq: Four times a day (QID) | ORAL | 0 refills | Status: DC

## 2022-05-01 ENCOUNTER — Encounter: Payer: Self-pay | Admitting: Nurse Practitioner

## 2022-05-01 DIAGNOSIS — B3731 Acute candidiasis of vulva and vagina: Secondary | ICD-10-CM

## 2022-05-01 MED ORDER — FLUCONAZOLE 150 MG PO TABS: ORAL_TABLET | ORAL | 2 refills | Status: DC

## 2022-05-01 MED ORDER — NYSTATIN 100000 UNIT/GM EX CREA: TOPICAL_CREAM | CUTANEOUS | 3 refills | Status: DC

## 2022-05-07 DIAGNOSIS — J209 Acute bronchitis, unspecified: Secondary | ICD-10-CM | POA: Diagnosis not present

## 2022-05-07 DIAGNOSIS — R0609 Other forms of dyspnea: Secondary | ICD-10-CM | POA: Diagnosis not present

## 2022-05-23 NOTE — Assessment & Plan Note (Signed)
Chronic. Controlled at this time with current regimen. No alarm sx present. Will continue current medication and close monitoring. She is on daily maintenance medication and as needed medication for breakthrough symptoms.  PDMP reviewed today.  Will follow-up in 6 months or sooner if needed

## 2022-05-23 NOTE — Assessment & Plan Note (Signed)
Chronic.  Well-managed on current time.  No changes to plan of care.

## 2022-05-23 NOTE — Assessment & Plan Note (Signed)
Time.  NoChronic.  Well-controlled with symptoms are present.  Continue current medication regimen.  Labs pending.  Plan to follow-up in 6 months or sooner if needed.

## 2022-05-23 NOTE — Assessment & Plan Note (Signed)
Chronic.  She is doing well on her current regimen.  Screening questions do indicate positive responses however patient reports that this is her baseline and she denies any concerns with these levels.  We will continue the current regimen and continue close monitoring.  Will plan to follow-up in 6 months or sooner if needed.

## 2022-06-12 IMAGING — DX DG CHEST 2V
2 series · 2 of 2 positions shown · non-contrast
Comparison: Chest x-ray 01/13/2017.

CLINICAL DATA: Cough and congestion.

EXAM:
CHEST - 2 VIEW

[chest pa]
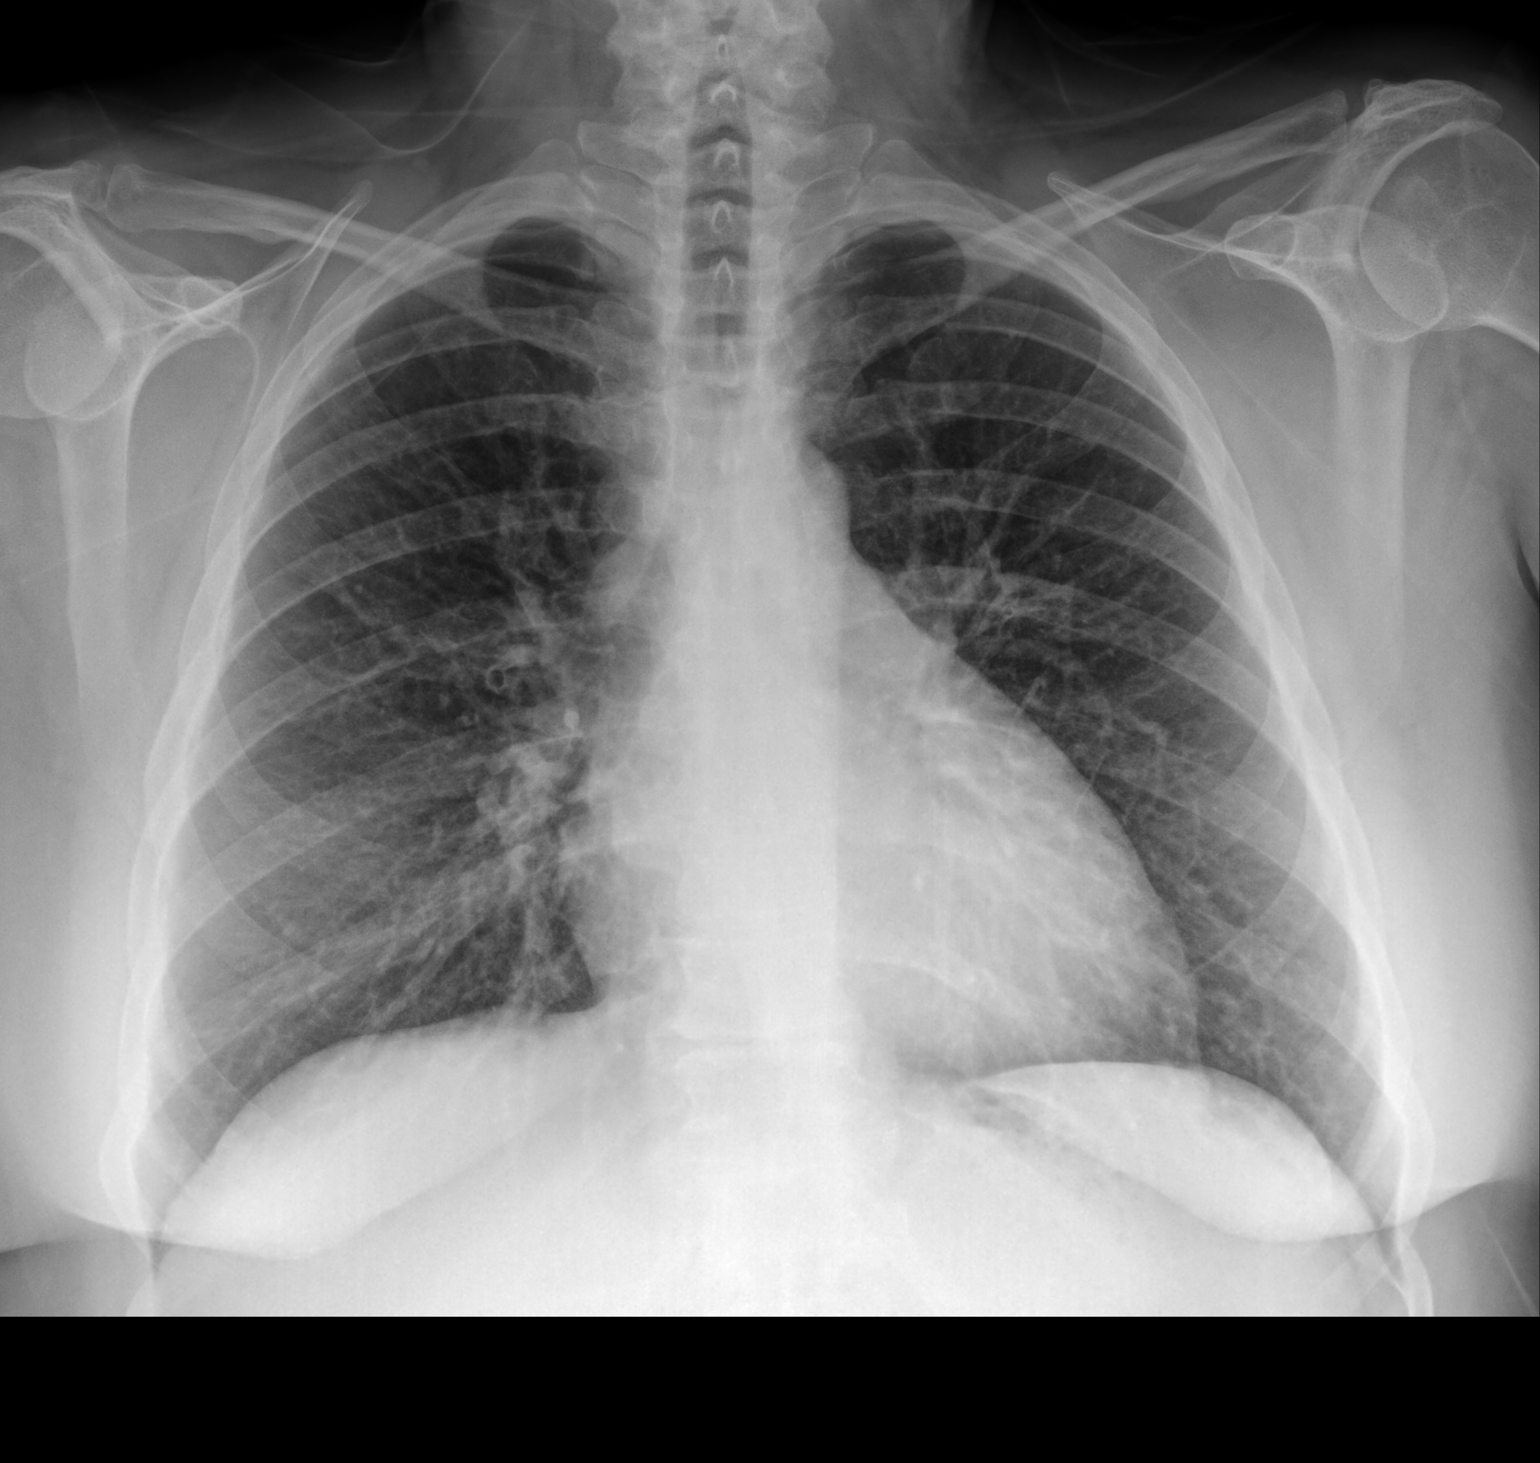

[chest lat]
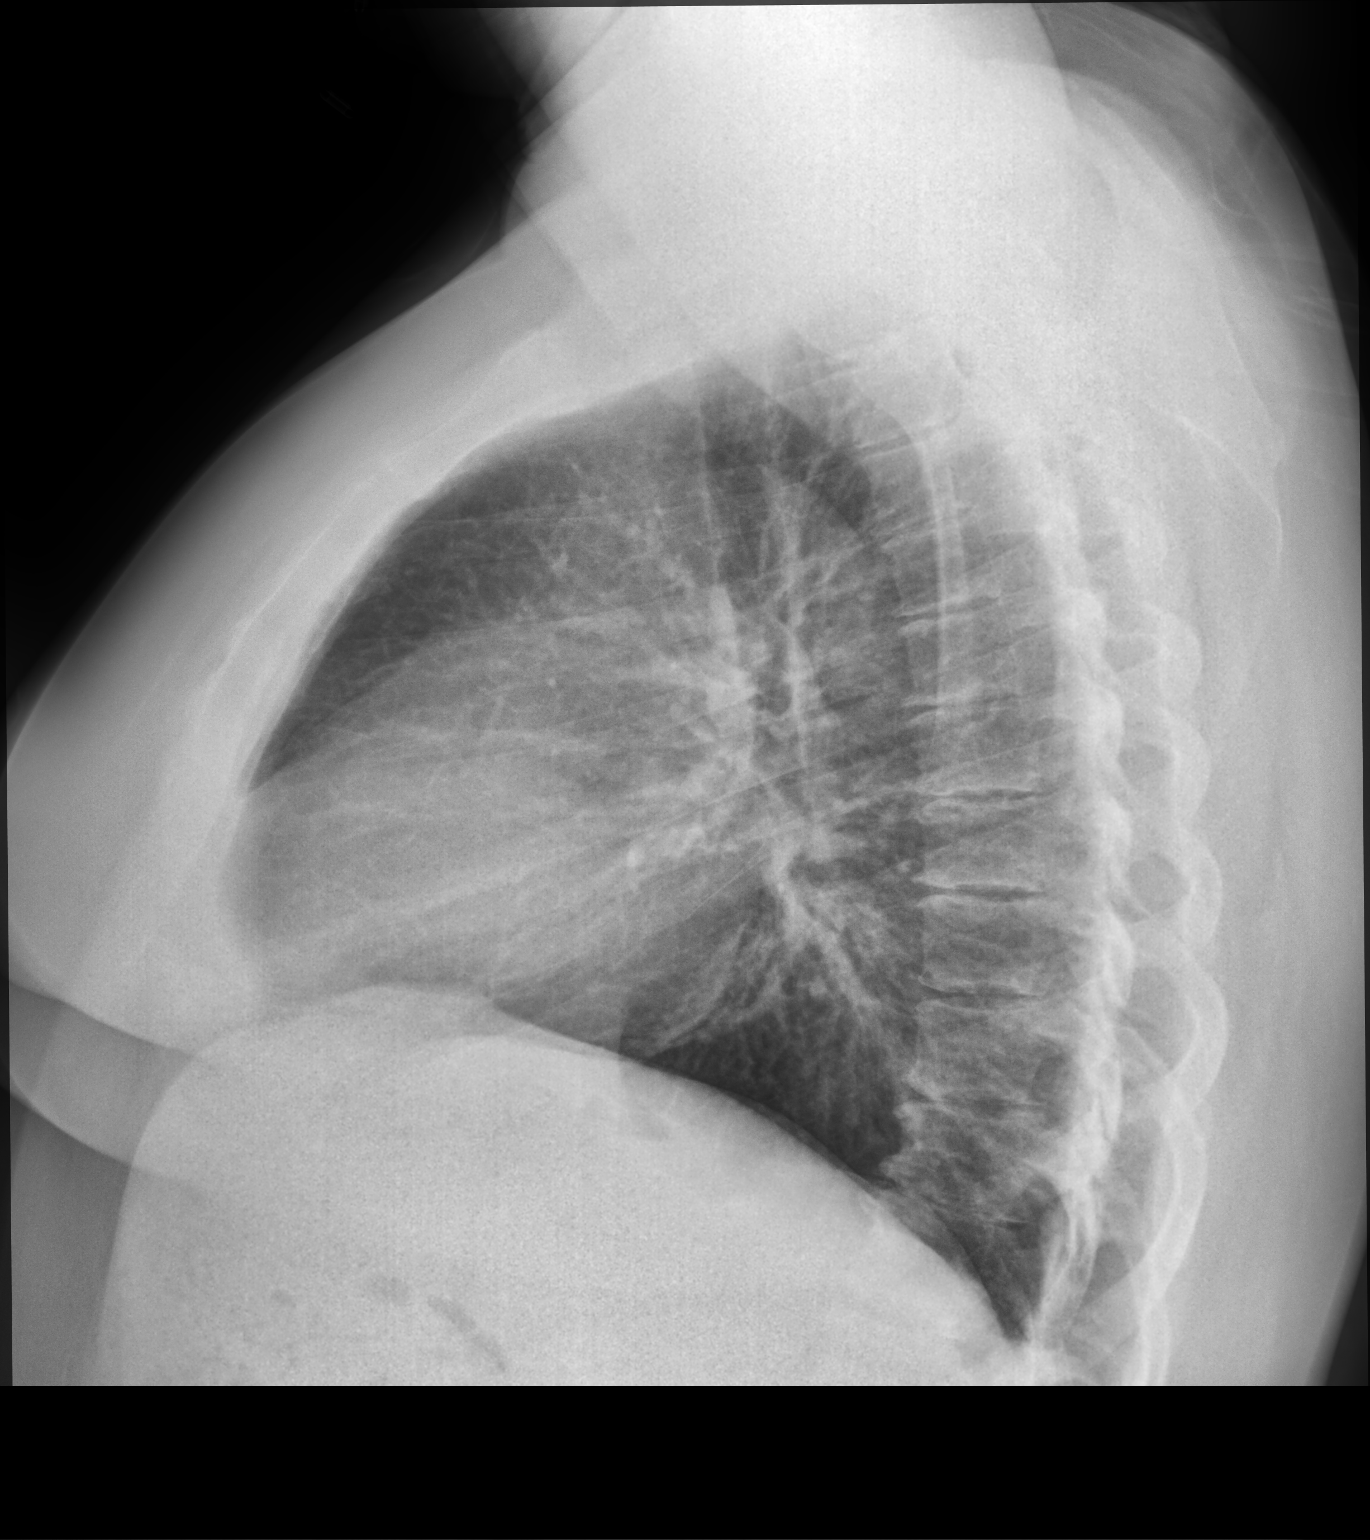

[2 of 2 positions shown; findings below may reference images not displayed]

FINDINGS: The heart size and mediastinal contours are within normal limits.
Both lungs are clear. The visualized skeletal structures are
unremarkable.
IMPRESSION: No active cardiopulmonary disease.

## 2022-07-24 ENCOUNTER — Encounter: Payer: Self-pay | Admitting: Emergency Medicine

## 2022-07-24 ENCOUNTER — Ambulatory Visit
Admission: EM | Admit: 2022-07-24 | Discharge: 2022-07-24 | Disposition: A | Discharge status: Home / Self Care | Payer: 59 | Attending: Family Medicine | Admitting: Family Medicine

## 2022-07-24 ENCOUNTER — Other Ambulatory Visit: Payer: Self-pay

## 2022-07-24 DIAGNOSIS — R062 Wheezing: Secondary | ICD-10-CM | POA: Diagnosis not present

## 2022-07-24 DIAGNOSIS — U071 COVID-19: Secondary | ICD-10-CM | POA: Diagnosis not present

## 2022-07-24 MED ORDER — PROMETHAZINE-DM 6.25-15 MG/5ML PO SYRP: 5.0000 mL | ORAL_SOLUTION | Freq: Four times a day (QID) | ORAL | 0 refills | Status: DC | PRN

## 2022-07-24 MED ORDER — ALBUTEROL SULFATE HFA 108 (90 BASE) MCG/ACT IN AERS: 2.0000 | INHALATION_SPRAY | RESPIRATORY_TRACT | 0 refills | Status: DC | PRN

## 2022-07-24 MED ORDER — DEXAMETHASONE SODIUM PHOSPHATE 10 MG/ML IJ SOLN
10.0000 mg | Freq: Once | INTRAMUSCULAR | Status: AC
  Administered 2022-07-24: 10 mg via INTRAMUSCULAR

## 2022-07-24 NOTE — ED Provider Notes (Signed)
[facility]    CSN: [id number] Arrival date & time: 07/24/22  1256      History   Chief Complaint Chief Complaint  Patient presents with   Fatigue    HPI Sarah Espinoza is a 47 y.o. female.   Presenting today with about a week of fatigue, body aches, nasal congestion, cough.  States she tested positive for COVID last Monday and feels like she is no better.  Denies chest pain, shortness of breath, abdominal pain, nausea vomiting or diarrhea.  Has been taking Mucinex with minimal relief.  Does have an albuterol inhaler at home for as needed use for wheezing but states she has not been taking it "like she should ".   Past Medical History:  Diagnosis Date   Acute non-recurrent pansinusitis September 26, 2021   Adjustment disorder 09-26-21   Bipolar 1 disorder ([place])    Death of relative September 26, 2021   Employment problem 26-Sep-2021   Encounter for screening for diabetes mellitus 07/04/2021   Family disruption due to divorce 09/26/21   Grief 2021/09/26   Hypertension    Intermittent palpitations 07/04/2021   Paronychia of left index finger 07/04/2021    Patient Active Problem List   Diagnosis Date Noted   Body mass index (BMI) 35.0-35.9, adult September 26, 2021   Gastroesophageal reflux disease 2021/09/26   Generalized anxiety disorder 09/26/21   Hyperlipidemia 26-Sep-2021   Moderate recurrent major depression ([place]) September 26, 2021   Other seasonal allergic rhinitis Sep 26, 2021   Parent/child conflict [id number]   Sleep disturbance 26-Sep-2021   Tobacco abuse 2021-09-26   Vitamin D deficiency 07/04/2021   Bipolar disorder ([place]) 07/04/2021   Primary hypertension 07/04/2021   Heart murmur 07/04/2021    Past Surgical History:  Procedure Laterality Date   LEEP  2006    OB History   No obstetric history on file.      Home Medications    Prior to Admission medications   Medication Sig Start Date End Date Taking? Authorizing Provider  albuterol (VENTOLIN HFA) 108  (90 Base) MCG/ACT inhaler Inhale 2 puffs into the lungs every 4 (four) hours as needed for wheezing or shortness of breath. 07/24/22  Yes Volney American, PA-C  promethazine-dextromethorphan (PROMETHAZINE-DM) 6.25-15 MG/5ML syrup Take 5 mLs by mouth 4 (four) times daily as needed. 07/24/22  Yes Volney American, PA-C  albuterol (VENTOLIN HFA) 108 (90 Base) MCG/ACT inhaler Inhale 1-2 puffs into the lungs every 6 (six) hours as needed for wheezing or shortness of breath. 08/23/21   Jaynee Eagles, PA-C  cephALEXin (KEFLEX) 500 MG capsule Take 1 capsule (500 mg total) by mouth 4 (four) times daily. 04/20/22   Orma Render, NP  clonazePAM (KLONOPIN) 0.5 MG tablet Take 1 tablet (0.5 mg total) by mouth 2 (two) times daily as needed. 04/05/22   Orma Render, NP  fluconazole (DIFLUCAN) 150 MG tablet Take one tablet by mouth at the first sign of symptoms of yeast. If no resolution, repeat dose in 72 hours. 05/01/22   Orma Render, NP  FLUoxetine (PROZAC) 40 MG capsule Take 1 capsule (40 mg total) by mouth 2 (two) times daily. 04/05/22   Orma Render, NP  gabapentin (NEURONTIN) 600 MG tablet Take 1 tablet (600 mg total) by mouth 3 (three) times daily. 04/05/22   Orma Render, NP  lisinopril-hydrochlorothiazide (ZESTORETIC) 20-25 MG tablet Take 1 tablet by mouth daily. 04/05/22   Orma Render, NP  nystatin cream (MYCOSTATIN) Apply to affected area 2 times daily for yeast  05/01/22   Early, Coralee Pesa, NP  ondansetron (ZOFRAN) 4 MG tablet Take 1 tablet (4 mg total) by mouth every 8 (eight) hours as needed for nausea or vomiting. 04/05/22   Orma Render, NP  Oxcarbazepine (TRILEPTAL) 300 MG tablet Take 2 tablets (600 mg total) by mouth daily. 04/05/22   Orma Render, NP  traZODone (DESYREL) 150 MG tablet Take 1 tablet (150 mg total) by mouth at bedtime. 08/21/21   Orma Render, NP  doxepin (SINEQUAN) 25 MG capsule Take 25 mg by mouth at bedtime. 10/07/14 11/24/18  [provider]  propranolol (INNOPRAN  XL) 120 MG 24 hr capsule Take 120 mg by mouth at bedtime.  11/24/18  [provider]    Family History Family History  Problem Relation Age of Onset   Healthy Mother    Healthy Father     Social History Social History   Tobacco Use   Smoking status: Every Day    Packs/day: 1.00    Types: Cigarettes   Smokeless tobacco: Never  Vaping Use   Vaping Use: Never used  Substance Use Topics   Alcohol use: No   Drug use: No     Allergies   Patient has no known allergies.   Review of Systems Review of Systems PER HPI  Physical Exam Triage Vital Signs ED Triage Vitals  Enc Vitals Group     BP 07/24/22 1450 102/60     Pulse Rate 07/24/22 1450 73     Resp 07/24/22 1450 20     Temp 07/24/22 1450 98.7 F (37.1 C)     Temp Source 07/24/22 1450 Oral     SpO2 07/24/22 1450 96 %     Weight --      Height --      Head Circumference --      Peak Flow --      Pain Score 07/24/22 1448 0     Pain Loc --      Pain Edu? --      Excl. in [place]? --    No data found.  Updated Vital Signs BP 102/60 (BP Location: Right Arm)   Pulse 73   Temp 98.7 F (37.1 C) (Oral)   Resp 20   LMP 07/08/2022 (Approximate)   SpO2 96%   Visual Acuity Right Eye Distance:   Left Eye Distance:   Bilateral Distance:    Right Eye Near:   Left Eye Near:    Bilateral Near:     Physical Exam Vitals and nursing note reviewed.  Constitutional:      Appearance: Normal appearance.  HENT:     Head: Atraumatic.     Right Ear: Tympanic membrane and external ear normal.     Left Ear: Tympanic membrane and external ear normal.     Mouth/Throat:     Mouth: Mucous membranes are moist.     Pharynx: Posterior oropharyngeal erythema present.  Eyes:     Extraocular Movements: Extraocular movements intact.     Conjunctiva/sclera: Conjunctivae normal.  Cardiovascular:     Rate and Rhythm: Normal rate and regular rhythm.     Heart sounds: Normal heart sounds.  Pulmonary:     Effort: Pulmonary  effort is normal.     Breath sounds: Wheezing present. No rales.  Musculoskeletal:        General: Normal range of motion.     Cervical back: Normal range of motion and neck supple.  Skin:    General:  Skin is warm and dry.  Neurological:     Mental Status: She is alert and oriented to person, place, and time.  Psychiatric:        Mood and Affect: Mood normal.        Thought Content: Thought content normal.      UC Treatments / Results  Labs (all labs ordered are listed, but only abnormal results are displayed) Labs Reviewed - No data to display  EKG   Radiology No results found.  Procedures Procedures (including critical care time)  Medications Ordered in UC Medications  dexamethasone (DECADRON) injection 10 mg (10 mg Intramuscular Given 07/24/22 1554)    Initial Impression / Assessment and Plan / UC Course  I have reviewed the triage vital signs and the nursing notes.  Pertinent labs & imaging results that were available during my care of the patient were reviewed by me and considered in my medical decision making (see chart for details).     Vital signs and exam overall reassuring today, having some persistent wheezing at the tail end of COVID-19 and still with fatigue, congestion.  Will provide a work note for several more days off work, give IM Decadron, refill albuterol inhaler and give Phenergan DM.  Discussed supportive over-the-counter medications, home care and good hydration.  Return for any worsening symptoms. Final Clinical Impressions(s) / UC Diagnoses   Final diagnoses:  COVID-19  Wheezing   Discharge Instructions   None    ED Prescriptions     Medication Sig Dispense Auth. Provider   albuterol (VENTOLIN HFA) 108 (90 Base) MCG/ACT inhaler Inhale 2 puffs into the lungs every 4 (four) hours as needed for wheezing or shortness of breath. [place], [place]   promethazine-dextromethorphan (PROMETHAZINE-DM) 6.25-15 MG/5ML syrup Take 5  mLs by mouth 4 (four) times daily as needed. 100 mL Volney American, [place]      PDMP not reviewed this encounter.   Volney American, [place] 07/24/22 1640

## 2022-07-24 NOTE — ED Triage Notes (Signed)
Pt reports tested positive for covid last Monday and reports continued fatigue, runny nose, cough,body aches.

## 2022-08-17 ENCOUNTER — Other Ambulatory Visit (HOSPITAL_BASED_OUTPATIENT_CLINIC_OR_DEPARTMENT_OTHER): Payer: Self-pay | Admitting: Nurse Practitioner

## 2022-08-17 DIAGNOSIS — F319 Bipolar disorder, unspecified: Secondary | ICD-10-CM

## 2022-08-20 NOTE — Telephone Encounter (Signed)
Refill request last apt 04/05/22. 

## 2022-08-24 ENCOUNTER — Encounter: Payer: Self-pay | Admitting: Nurse Practitioner

## 2022-09-06 ENCOUNTER — Telehealth: Payer: 59 | Admitting: Urgent Care

## 2022-09-06 ENCOUNTER — Encounter: Payer: Self-pay | Admitting: Nurse Practitioner

## 2022-09-06 DIAGNOSIS — N3000 Acute cystitis without hematuria: Secondary | ICD-10-CM

## 2022-09-06 MED ORDER — FLUCONAZOLE 150 MG PO TABS: 150.0000 mg | ORAL_TABLET | Freq: Once | ORAL | 0 refills | Status: AC

## 2022-09-06 MED ORDER — NITROFURANTOIN MONOHYD MACRO 100 MG PO CAPS: 100.0000 mg | ORAL_CAPSULE | Freq: Two times a day (BID) | ORAL | 0 refills | Status: AC

## 2022-09-06 MED ORDER — PHENAZOPYRIDINE HCL 100 MG PO TABS: 100.0000 mg | ORAL_TABLET | Freq: Three times a day (TID) | ORAL | 0 refills | Status: AC | PRN

## 2022-09-06 NOTE — Progress Notes (Signed)
Virtual Visit Consent   Sarah Espinoza, you are scheduled for a virtual visit with a [facility] provider today. Just as with appointments in the office, your consent must be obtained to participate. Your consent will be active for this visit and any virtual visit you may have with one of our providers in the next 365 days. If you have a MyChart account, a copy of this consent can be sent to you electronically.  As this is a virtual visit, video technology does not allow for your provider to perform a traditional examination. This may limit your provider's ability to fully assess your condition. If your provider identifies any concerns that need to be evaluated in person or the need to arrange testing (such as labs, EKG, etc.), we will make arrangements to do so. Although advances in technology are sophisticated, we cannot ensure that it will always work on either your end or our end. If the connection with a video visit is poor, the visit may have to be switched to a telephone visit. With either a video or telephone visit, we are not always able to ensure that we have a secure connection.  By engaging in this virtual visit, you consent to the provision of healthcare and authorize for your insurance to be billed (if applicable) for the services provided during this visit. Depending on your insurance coverage, you may receive a charge related to this service.  I need to obtain your verbal consent now. Are you willing to proceed with your visit today? Sarah Espinoza has provided verbal consent on 09/06/2022 for a virtual visit (video or telephone). Chaney Malling, PA  Date: 09/06/2022 2:48 PM  Virtual Visit via Video Note   I, [place], connected with  Sarah Espinoza  ([id number], 02-23-1976) on 09/06/22 at  2:45 PM EDT by a video-enabled telemedicine application and verified that I am speaking with the correct person using two identifiers.  Location: Patient: Virtual Visit Location  Patient: Other: work Provider: Ecologist: Home Office   I discussed the limitations of evaluation and management by telemedicine and the availability of in person appointments. The patient expressed understanding and agreed to proceed.    History of Present Illness: Sarah Espinoza is a 47 y.o. who identifies as a female who was assigned female at birth, and is being seen today for UTI.  HPI: 3-4 days c/o Burning, frequency, hesitancy. Denies hematuria, pelvic pain, discharge, flank pain or fevers. Tried AZO without relief. Hx of UTIs in past, feels similar.  Problems:  Patient Active Problem List   Diagnosis Date Noted   Body mass index (BMI) 35.0-35.9, adult 08/29/2021   Gastroesophageal reflux disease 08/29/2021   Generalized anxiety disorder 08/29/2021   Hyperlipidemia 08/29/2021   Moderate recurrent major depression 08/29/2021   Other seasonal allergic rhinitis 08/29/2021   Parent/child conflict [id number]   Sleep disturbance 08/29/2021   Tobacco abuse 08/29/2021   Vitamin D deficiency 07/04/2021   Bipolar disorder 07/04/2021   Primary hypertension 07/04/2021   Heart murmur 07/04/2021    Allergies: No Known Allergies Medications:  Current Outpatient Medications:    fluconazole (DIFLUCAN) 150 MG tablet, Take 1 tablet (150 mg total) by mouth once for 1 dose., Disp: 1 tablet, Rfl: 0   nitrofurantoin, macrocrystal-monohydrate, (MACROBID) 100 MG capsule, Take 1 capsule (100 mg total) by mouth 2 (two) times daily for 7 days., Disp: 14 capsule, Rfl: 0   phenazopyridine (PYRIDIUM) 100 MG tablet, Take 1 tablet (  100 mg total) by mouth 3 (three) times daily as needed for up to 2 days for pain., Disp: 6 tablet, Rfl: 0   albuterol (VENTOLIN HFA) 108 (90 Base) MCG/ACT inhaler, Inhale 1-2 puffs into the lungs every 6 (six) hours as needed for wheezing or shortness of breath., Disp: 18 g, Rfl: 0   albuterol (VENTOLIN HFA) 108 (90 Base) MCG/ACT inhaler, Inhale 2 puffs  into the lungs every 4 (four) hours as needed for wheezing or shortness of breath., Disp: 18 g, Rfl: 0   clonazePAM (KLONOPIN) 0.5 MG tablet, Take 1 tablet by mouth twice daily as needed, Disp: 60 tablet, Rfl: 0   FLUoxetine (PROZAC) 40 MG capsule, Take 1 capsule (40 mg total) by mouth 2 (two) times daily., Disp: 180 capsule, Rfl: 3   gabapentin (NEURONTIN) 600 MG tablet, Take 1 tablet (600 mg total) by mouth 3 (three) times daily., Disp: 270 tablet, Rfl: 3   lisinopril-hydrochlorothiazide (ZESTORETIC) 20-25 MG tablet, Take 1 tablet by mouth daily., Disp: 90 tablet, Rfl: 3   Oxcarbazepine (TRILEPTAL) 300 MG tablet, Take 2 tablets (600 mg total) by mouth daily., Disp: 180 tablet, Rfl: 3   traZODone (DESYREL) 150 MG tablet, Take 1 tablet (150 mg total) by mouth at bedtime., Disp: 90 tablet, Rfl: 3  Observations/Objective: Patient is well-developed, well-nourished in no acute distress.  Resting comfortably at work.  Head is normocephalic, atraumatic.  No labored breathing.  Speech is clear and coherent with logical content.  Patient is alert and oriented at baseline.    Assessment and Plan: 1. Acute cystitis without hematuria  Macrobid and pyridium for the UTI sx. Pt requesting diflucan due to hx of yeast s/p abx  Follow Up Instructions: I discussed the assessment and treatment plan with the patient. The patient was provided an opportunity to ask questions and all were answered. The patient agreed with the plan and demonstrated an understanding of the instructions.  A copy of instructions were sent to the patient via MyChart unless otherwise noted below.    The patient was advised to call back or seek an in-person evaluation if the symptoms worsen or if the condition fails to improve as anticipated.  Time:  I spent 6 minutes with the patient via telehealth technology discussing the above problems/concerns.    [place], PA

## 2022-09-06 NOTE — Patient Instructions (Signed)
Vira Agar, thank you for joining Chaney Malling, PA for today's virtual visit.  While this provider is not your primary care provider (PCP), if your PCP is located in our provider database this encounter information will be shared with them immediately following your visit.   [place] [facility] account gives you access to today's visit and all your visits, tests, and labs performed at [facility] " click here if you don't have a [facility] account or go to [email or web address]  Consent: (Patient) Sarah Espinoza provided verbal consent for this virtual visit at the beginning of the encounter.  Current Medications:  Current Outpatient Medications:    fluconazole (DIFLUCAN) 150 MG tablet, Take 1 tablet (150 mg total) by mouth once for 1 dose., Disp: 1 tablet, Rfl: 0   nitrofurantoin, macrocrystal-monohydrate, (MACROBID) 100 MG capsule, Take 1 capsule (100 mg total) by mouth 2 (two) times daily for 7 days., Disp: 14 capsule, Rfl: 0   phenazopyridine (PYRIDIUM) 100 MG tablet, Take 1 tablet (100 mg total) by mouth 3 (three) times daily as needed for up to 2 days for pain., Disp: 6 tablet, Rfl: 0   albuterol (VENTOLIN HFA) 108 (90 Base) MCG/ACT inhaler, Inhale 1-2 puffs into the lungs every 6 (six) hours as needed for wheezing or shortness of breath., Disp: 18 g, Rfl: 0   albuterol (VENTOLIN HFA) 108 (90 Base) MCG/ACT inhaler, Inhale 2 puffs into the lungs every 4 (four) hours as needed for wheezing or shortness of breath., Disp: 18 g, Rfl: 0   clonazePAM (KLONOPIN) 0.5 MG tablet, Take 1 tablet by mouth twice daily as needed, Disp: 60 tablet, Rfl: 0   FLUoxetine (PROZAC) 40 MG capsule, Take 1 capsule (40 mg total) by mouth 2 (two) times daily., Disp: 180 capsule, Rfl: 3   gabapentin (NEURONTIN) 600 MG tablet, Take 1 tablet (600 mg total) by mouth 3 (three) times daily., Disp: 270 tablet, Rfl: 3   lisinopril-hydrochlorothiazide (ZESTORETIC) 20-25 MG tablet, Take  1 tablet by mouth daily., Disp: 90 tablet, Rfl: 3   Oxcarbazepine (TRILEPTAL) 300 MG tablet, Take 2 tablets (600 mg total) by mouth daily., Disp: 180 tablet, Rfl: 3   traZODone (DESYREL) 150 MG tablet, Take 1 tablet (150 mg total) by mouth at bedtime., Disp: 90 tablet, Rfl: 3   Medications ordered in this encounter:  Meds ordered this encounter  Medications   nitrofurantoin, macrocrystal-monohydrate, (MACROBID) 100 MG capsule    Sig: Take 1 capsule (100 mg total) by mouth 2 (two) times daily for 7 days.    Dispense:  14 capsule    Refill:  0    Order Specific Question:   Supervising Provider    Answer:   Chase Picket [id number]   phenazopyridine (PYRIDIUM) 100 MG tablet    Sig: Take 1 tablet (100 mg total) by mouth 3 (three) times daily as needed for up to 2 days for pain.    Dispense:  6 tablet    Refill:  0    Order Specific Question:   Supervising Provider    Answer:   Chase Picket [id number]   fluconazole (DIFLUCAN) 150 MG tablet    Sig: Take 1 tablet (150 mg total) by mouth once for 1 dose.    Dispense:  1 tablet    Refill:  0    Order Specific Question:   Supervising Provider    Answer:   Chase Picket [id number]     *If you need refills on  other medications prior to your next appointment, please contact your pharmacy*  Follow-Up: Call back or seek an in-person evaluation if the symptoms worsen or if the condition fails to improve as anticipated.  [place] [phone]  Other Instructions Start taking macrobid twice daily x 7 days Take pyridium up to three times daily for 2 days as needed for bladder pain. This will turn your urine orange. Increase your water intake. Only use diflucan if symptoms of vaginal yeast infection occur. If symptoms persist despite treatment, please seek in person medical care.   If you have been instructed to have an in-person evaluation today at a local Urgent Care facility, please use the link below. It will  take you to a list of all of our available [facility], including address, phone number and hours of operation. Please do not delay care.  [facility]  If you or a family member do not have a primary care provider, use the link below to schedule a visit and establish care. When you choose a [facility] primary care physician or advanced practice provider, you gain a long-term partner in health. Find a Primary Care Provider  Learn more about [facility]'s in-office and virtual care options: [facility]

## 2022-10-02 ENCOUNTER — Ambulatory Visit: Payer: Self-pay | Admitting: Medical

## 2022-10-05 ENCOUNTER — Ambulatory Visit (INDEPENDENT_AMBULATORY_CARE_PROVIDER_SITE_OTHER): Payer: 59 | Admitting: Nurse Practitioner

## 2022-10-05 ENCOUNTER — Encounter: Payer: Self-pay | Admitting: Nurse Practitioner

## 2022-10-05 VITALS — BP 130/82 | HR 64 | Wt 178.4 lb

## 2022-10-05 DIAGNOSIS — F411 Generalized anxiety disorder: Secondary | ICD-10-CM

## 2022-10-05 DIAGNOSIS — M2559 Pain in other specified joint: Secondary | ICD-10-CM | POA: Diagnosis not present

## 2022-10-05 DIAGNOSIS — R634 Abnormal weight loss: Secondary | ICD-10-CM

## 2022-10-05 DIAGNOSIS — I10 Essential (primary) hypertension: Secondary | ICD-10-CM

## 2022-10-05 DIAGNOSIS — D509 Iron deficiency anemia, unspecified: Secondary | ICD-10-CM | POA: Diagnosis not present

## 2022-10-05 DIAGNOSIS — E559 Vitamin D deficiency, unspecified: Secondary | ICD-10-CM

## 2022-10-05 DIAGNOSIS — E039 Hypothyroidism, unspecified: Secondary | ICD-10-CM

## 2022-10-05 DIAGNOSIS — R011 Cardiac murmur, unspecified: Secondary | ICD-10-CM

## 2022-10-05 DIAGNOSIS — E782 Mixed hyperlipidemia: Secondary | ICD-10-CM

## 2022-10-05 DIAGNOSIS — F902 Attention-deficit hyperactivity disorder, combined type: Secondary | ICD-10-CM | POA: Diagnosis not present

## 2022-10-05 DIAGNOSIS — F319 Bipolar disorder, unspecified: Secondary | ICD-10-CM | POA: Diagnosis not present

## 2022-10-05 MED ORDER — CELECOXIB 200 MG PO CAPS
200.0000 mg | ORAL_CAPSULE | Freq: Two times a day (BID) | ORAL | 3 refills | Status: DC
Start: 2022-10-05 — End: 2023-11-21

## 2022-10-05 MED ORDER — LISDEXAMFETAMINE DIMESYLATE 20 MG PO CAPS
20.0000 mg | ORAL_CAPSULE | Freq: Every day | ORAL | 0 refills | Status: DC
Start: 2022-11-04 — End: 2023-07-31

## 2022-10-05 MED ORDER — LISDEXAMFETAMINE DIMESYLATE 20 MG PO CAPS: 20.0000 mg | ORAL_CAPSULE | Freq: Every day | ORAL | 0 refills | Status: DC

## 2022-10-05 MED ORDER — LISDEXAMFETAMINE DIMESYLATE 20 MG PO CAPS
20.0000 mg | ORAL_CAPSULE | Freq: Every day | ORAL | 0 refills | Status: DC
Start: 2022-12-04 — End: 2023-07-31

## 2022-10-05 NOTE — Patient Instructions (Addendum)
I have sent a referral to psychiatry for you. I would feel more comfortable with them managing the issues onward to make sure we have the best care available for you.

## 2022-10-05 NOTE — Progress Notes (Signed)
[name], DNP, AGNP-c [facility]  [address] [place], [place] [address] [phone]  Subjective:   Sarah Espinoza is a 47 y.o. female presents to day for evaluation of: Multiple concerns. [name] reports concerns with attention, focus, mood, knee pain, right hip/groin pain, back pain, swelling in her feet  [name] reports groin, hip, and knee pain, suspecting it may be related to arthritis, which runs in their family. She has tried Lyrica for pain relief and noticed an improvement in her mood and knee pain. She is currently on gabapentin. She also experiences significant back pain. 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 She reports taking BC powders daily for pain relief, but expresses concerns about developing an ulcer from their use. She reports that [facility] powders help with her back pain but not with joint pain.  She also reports a history of ADHD, which significantly interferes with her daily life and work.She has previously taken Vyvanse to manage her ADHD symptoms but stopped due to insurance coverage issues. She reports struggles with hygiene, forgetfulness, and impulsivity, attention, focus, task completion, and name recall which affects her work performance. She reports a diagnosis at age 13.  [name] reports that she was out of work for 3 years due to COVID and she feels this has contributed to severe depression. She also suspects a thyroid issue but this has not been confirmed medically.  The patient is currently taking gabapentin as a mood stabilizer and has tried Lyrica from a friend for potential neuropathic pain related to previous shingles. She expresses concern that gabapentin might not be effective anymore and notes that Lyrica seemed to improve her mood significantly. The patient has a history of trying various antidepressants with limited success, finding some  effectiveness with Prozac and oxcarbazepine (Lamictal) for mood stabilization. She verbalizes tendencies and behaviors consistent with possible mania at times, but does not feel that these are true manic episodes and are more associated with ADHD.   The patient expresses concerns about her thyroid, focusing on whether it could be contributing to their weight loss. She mentions the possibility of hyperthyroidism and express a desire not to gain weight if placed on thyroid medication.  PMH, Medications, and Allergies reviewed and updated in chart as appropriate.   ROS negative except for what is listed in HPI. Objective:  BP 130/82   Pulse 64   Wt 178 lb 6.4 oz (80.9 kg)   BMI 28.79 kg/m  Physical Exam Vitals and nursing note reviewed.  HENT:     Head: Normocephalic.  Eyes:     Pupils: Pupils are equal, round, and reactive to light.  Cardiovascular:     Rate and Rhythm: Normal rate and regular rhythm.     Pulses: Normal pulses.     Heart sounds: Murmur heard.  Pulmonary:     Effort: Pulmonary effort is normal.     Breath sounds: Normal breath sounds.  Neurological:     Mental Status: She is alert.  Psychiatric:        Mood and Affect: Mood is anxious. Affect is tearful.        Speech: Speech is rapid and pressured.        Behavior: Behavior is agitated.        Cognition and Memory: Cognition and memory normal.           Assessment & Plan:   Problem List  Items Addressed This Visit     Vitamin D deficiency    Labs today for monitoring.      Hypothyroidism    Previous abnormality in TSH indicating hypothyroidism, but this resolved in the past. We will recheck today.       Relevant Orders   Hemoglobin A1c (Completed)   CBC with Differential/Platelet (Completed)   Comprehensive metabolic panel (Completed)   TSH (Completed)   T4, free (Completed)   VITAMIN D 25 Hydroxy (Vit-D Deficiency, Fractures) (Completed)   Bipolar disorder (HCC)    History of bipolar disorder  is on record with current treatment plan of fluoxetine, gabapentin, and oxcarbazepine. Her mood is low today, but thought processes are rapid with rapid and pressured speech. She moves quickly from subjects/concerns without following out a complete thought or subject or allowing for discussion. Frequent interruptions in conversation are present. There is a slight sense of panic in her behavior today which is not typical. It is difficult to discern if her behaviors are consistent with hypomania or possible ADHD exacerbation with anxiety associated with the symptoms that she is experiencing. There does appear to be a component of ADHD in her behaviors, consistent with previous diagnosis.  I discussed with her concerns over her medication management and reaching the limit of my capabilities within the primary care setting. I recommend that she seek the guidance of psychiatry to help determine the best plan of action to manage both her mood and ADHD to help reduce the risk of over or under medication. She is in agreement to see psychiatry to take over medication management at this time.  Plan: - Continue current regimen of fluoxetine, gabapentin, and oxcabazepine at this time.  - Avoid daily use of clonazepam as this is sedating and habit forming - Referral to psychiatry has been placed.  - I have sent in trial of vyvanse to see if this is helpful with focus and ADHD symptoms without causing mania.  - If you feel you are not getting control with vyvanse or find yourself more anxious or unable to sleep please stop vyvanse immediately and contact the office.       Relevant Orders   Ambulatory referral to Psychiatry   Primary hypertension    Chronic hypertension not at goal today. She is taking lisinopril-hctz for management. I suspect her level of agitation today may be contributing to her elevated blood pressures.  Plan: - Continue current medication - Monitor BP at home and report if readings are staying  higher than 130/80 for more than 2 days in a row       Relevant Orders   Hemoglobin A1c (Completed)   CBC with Differential/Platelet (Completed)   Comprehensive metabolic panel (Completed)   TSH (Completed)   T4, free (Completed)   VITAMIN D 25 Hydroxy (Vit-D Deficiency, Fractures) (Completed)   Heart murmur    Chronic murmur since childhood. No alarm symptoms are present. She has safely taken vyvanse in the past without any affect on her heart rate, therefore, I feel that reinstating this medication at low dose and monitoring closely is a safe option at this time. We will monitor closely.  Plan: - If you begin to feel palpitations, lightheadedness, dizziness, shortness of breath, or any other concerning symptoms, stop the vyvanse immediately and report these symptoms.       Relevant Orders   Hemoglobin A1c (Completed)   CBC with Differential/Platelet (Completed)   Comprehensive metabolic panel (Completed)   TSH (Completed)   T4,  free (Completed)   VITAMIN D 25 Hydroxy (Vit-D Deficiency, Fractures) (Completed)   Generalized anxiety disorder    Increased anxiety present over current situation. There are multiple factors coming into play at this time including pain, attention, depression, and possible hypomania. I do not feel that her symptoms are well controlled at this time. I discussed with her today that I feel for her best interest we need to seek the direction of psychiatry to help with further management of medications to better control her symptoms. She agrees to this today.  Plan: - Referral placed - No changes in medications aside from the addition of Vyvanse for ADHD      Relevant Orders   Hemoglobin A1c (Completed)   CBC with Differential/Platelet (Completed)   Comprehensive metabolic panel (Completed)   TSH (Completed)   T4, free (Completed)   VITAMIN D 25 Hydroxy (Vit-D Deficiency, Fractures) (Completed)   Ambulatory referral to Psychiatry   Hyperlipidemia     Chronic. Diet controlled. Will monitor labs today. She may need to start statin therapy based on findings.       Losing weight    Weight loss of unknown etiology reported. Unclear if medication, increased stress, or other factors are contributing. Records show in Jan. 2023 she was 226 and today she is 178, which is a significant difference. We will monitor labs today to determine if her thyroid could be contributing as she suspects or other factors are present.  Plan: - Labs today - Will consider low dose lung CT if labs normal due to smoking history      Relevant Orders   Hemoglobin A1c (Completed)   CBC with Differential/Platelet (Completed)   Comprehensive metabolic panel (Completed)   TSH (Completed)   T4, free (Completed)   VITAMIN D 25 Hydroxy (Vit-D Deficiency, Fractures) (Completed)   Attention deficit hyperactivity disorder (ADHD), combined type - Primary    History of ADHD from childhood with previous control with Vyvanse. Today she exhibits signs of ADHD but there is also concern that bipolar disorder may also be a contributor to her symptoms. Given that she has had success in the past with ADHD medication and the severity of her symptoms, we will restart Vyvanse at 20mg  and monitor closely. Referral for psychiatry is recommended and agreed upon by patient.  Plan: - Referral to psychiatry for better management of mood and ADHD - Vyvanse restarted at 20mg       Relevant Medications   lisdexamfetamine (VYVANSE) 20 MG capsule   lisdexamfetamine (VYVANSE) 20 MG capsule (Start on 11/04/2022)   lisdexamfetamine (VYVANSE) 20 MG capsule (Start on 12/04/2022)   Other Relevant Orders   Ambulatory referral to Psychiatry   Joint pain    Joint pain in multiple locations. Unable to rule out possible arthritic condition today, although, I would expect ASA to help with these symptoms. She is not having good control of pain with gabapentin. I am concerned with transitioning to a new sedative  medication with her current mental status and her current medication regimen. I explained to her that I am unable to prescribe any additional medications that may cause sedation for pain, but we can trial celebrex to see if this is helpful. She is in agreement. I would like to avoid any opiate pain medication due to the risks of respiratory depression and dependence with her current regimen. Some of her pain may be related to imbalance in her mood, and I am hopeful this will improve with better control. We can always  reassess once we have her mood stabilized if she is still experiencing pain.  Plan: - Start celebrex- do not take BC powder with this as they can interact - Continue gabapentin - Referral to psychiatry has been placed to see if Lyrica would be better for mental health.  - We can reassess pain if no improvement.      Relevant Medications   celecoxib (CELEBREX) 200 MG capsule      [name], DNP, AGNP-c 10/12/2022  7:43 AM    Time: 57 minutes, >50% spent counseling, care coordination, chart review, and documentation.   History, Medications, Surgery, SDOH, and Family History reviewed and updated as appropriate.

## 2022-10-06 LAB — COMPREHENSIVE METABOLIC PANEL
ALT: 27 IU/L (ref 0–32)
AST: 36 IU/L (ref 0–40)
Albumin/Globulin Ratio: 1.7 (ref 1.2–2.2)
Albumin: 4.3 g/dL (ref 3.9–4.9)
Alkaline Phosphatase: 140 IU/L — ABNORMAL HIGH (ref 44–121)
BUN/Creatinine Ratio: 21 (ref 9–23)
BUN: 15 mg/dL (ref 6–24)
Bilirubin Total: 0.2 mg/dL (ref 0.0–1.2)
CO2: 26 mmol/L (ref 20–29)
Calcium: 9.6 mg/dL (ref 8.7–10.2)
Chloride: 95 mmol/L — ABNORMAL LOW (ref 96–106)
Creatinine, Ser: 0.7 mg/dL (ref 0.57–1.00)
Globulin, Total: 2.6 g/dL (ref 1.5–4.5)
Glucose: 71 mg/dL (ref 70–99)
Potassium: 4.5 mmol/L (ref 3.5–5.2)
Sodium: 134 mmol/L (ref 134–144)
Total Protein: 6.9 g/dL (ref 6.0–8.5)
eGFR: 109 mL/min/{1.73_m2} (ref >59)

## 2022-10-06 LAB — CBC WITH DIFFERENTIAL/PLATELET
Basophils Absolute: 0 10*3/uL (ref 0.0–0.2)
Basos: 1 %
EOS (ABSOLUTE): 0.1 10*3/uL (ref 0.0–0.4)
Eos: 2 %
Hematocrit: 35 % (ref 34.0–46.6)
Hemoglobin: 10.7 g/dL — ABNORMAL LOW (ref 11.1–15.9)
Immature Grans (Abs): 0 10*3/uL (ref 0.0–0.1)
Immature Granulocytes: 0 %
Lymphocytes Absolute: 1.5 10*3/uL (ref 0.7–3.1)
Lymphs: 31 %
MCH: 25.5 pg — ABNORMAL LOW (ref 26.6–33.0)
MCHC: 30.6 g/dL — ABNORMAL LOW (ref 31.5–35.7)
MCV: 84 fL (ref 79–97)
Monocytes Absolute: 0.5 10*3/uL (ref 0.1–0.9)
Monocytes: 11 %
Neutrophils Absolute: 2.6 10*3/uL (ref 1.4–7.0)
Neutrophils: 55 %
Platelets: 378 10*3/uL (ref 150–450)
RBC: 4.19 x10E6/uL (ref 3.77–5.28)
RDW: 15.9 % — ABNORMAL HIGH (ref 11.7–15.4)
WBC: 4.8 10*3/uL (ref 3.4–10.8)

## 2022-10-06 LAB — T4, FREE: Free T4: 0.73 ng/dL — ABNORMAL LOW (ref 0.82–1.77)

## 2022-10-06 LAB — HEMOGLOBIN A1C
Est. average glucose Bld gHb Est-mCnc: 97 mg/dL
Hgb A1c MFr Bld: 5 % (ref 4.8–5.6)

## 2022-10-06 LAB — TSH: TSH: 5.6 u[IU]/mL — ABNORMAL HIGH (ref 0.450–4.500)

## 2022-10-06 LAB — VITAMIN D 25 HYDROXY (VIT D DEFICIENCY, FRACTURES): Vit D, 25-Hydroxy: 58.4 ng/mL (ref 30.0–100.0)

## 2022-10-09 ENCOUNTER — Other Ambulatory Visit (HOSPITAL_BASED_OUTPATIENT_CLINIC_OR_DEPARTMENT_OTHER): Payer: Self-pay | Admitting: Nurse Practitioner

## 2022-10-09 DIAGNOSIS — F319 Bipolar disorder, unspecified: Secondary | ICD-10-CM

## 2022-10-12 ENCOUNTER — Encounter: Payer: Self-pay | Admitting: Nurse Practitioner

## 2022-10-12 DIAGNOSIS — F902 Attention-deficit hyperactivity disorder, combined type: Secondary | ICD-10-CM | POA: Insufficient documentation

## 2022-10-12 DIAGNOSIS — M2550 Pain in unspecified joint: Secondary | ICD-10-CM | POA: Insufficient documentation

## 2022-10-12 MED ORDER — LEVOTHYROXINE SODIUM 25 MCG PO TABS
25.0000 ug | ORAL_TABLET | Freq: Every day | ORAL | 3 refills | Status: DC
Start: 2022-10-12 — End: 2023-10-11

## 2022-10-12 MED ORDER — IRON (FERROUS SULFATE) 325 (65 FE) MG PO TABS
ORAL_TABLET | ORAL | 1 refills | Status: DC
Start: 2022-10-12 — End: 2023-04-02

## 2022-10-12 NOTE — Assessment & Plan Note (Signed)
Chronic murmur since childhood. No alarm symptoms are present. She has safely taken vyvanse in the past without any affect on her heart rate, therefore, I feel that reinstating this medication at low dose and monitoring closely is a safe option at this time. We will monitor closely.  Plan: - If you begin to feel palpitations, lightheadedness, dizziness, shortness of breath, or any other concerning symptoms, stop the vyvanse immediately and report these symptoms.

## 2022-10-12 NOTE — Assessment & Plan Note (Signed)
Joint pain in multiple locations. Unable to rule out possible arthritic condition today, although, I would expect ASA to help with these symptoms. She is not having good control of pain with gabapentin. I am concerned with transitioning to a new sedative medication with her current mental status and her current medication regimen. I explained to her that I am unable to prescribe any additional medications that may cause sedation for pain, but we can trial celebrex to see if this is helpful. She is in agreement. I would like to avoid any opiate pain medication due to the risks of respiratory depression and dependence with her current regimen. Some of her pain may be related to imbalance in her mood, and I am hopeful this will improve with better control. We can always reassess once we have her mood stabilized if she is still experiencing pain.  Plan: - Start celebrex- do not take BC powder with this as they can interact - Continue gabapentin - Referral to psychiatry has been placed to see if Lyrica would be better for mental health.  - We can reassess pain if no improvement.

## 2022-10-12 NOTE — Assessment & Plan Note (Signed)
Previous abnormality in TSH indicating hypothyroidism, but this resolved in the past. We will recheck today.

## 2022-10-12 NOTE — Assessment & Plan Note (Signed)
History of bipolar disorder is on record with current treatment plan of fluoxetine, gabapentin, and oxcarbazepine. Her mood is low today, but thought processes are rapid with rapid and pressured speech. She moves quickly from subjects/concerns without following out a complete thought or subject or allowing for discussion. Frequent interruptions in conversation are present. There is a slight sense of panic in her behavior today which is not typical. It is difficult to discern if her behaviors are consistent with hypomania or possible ADHD exacerbation with anxiety associated with the symptoms that she is experiencing. There does appear to be a component of ADHD in her behaviors, consistent with previous diagnosis.  I discussed with her concerns over her medication management and reaching the limit of my capabilities within the primary care setting. I recommend that she seek the guidance of psychiatry to help determine the best plan of action to manage both her mood and ADHD to help reduce the risk of over or under medication. She is in agreement to see psychiatry to take over medication management at this time.  Plan: - Continue current regimen of fluoxetine, gabapentin, and oxcabazepine at this time.  - Avoid daily use of clonazepam as this is sedating and habit forming - Referral to psychiatry has been placed.  - I have sent in trial of vyvanse to see if this is helpful with focus and ADHD symptoms without causing mania.  - If you feel you are not getting control with vyvanse or find yourself more anxious or unable to sleep please stop vyvanse immediately and contact the office.

## 2022-10-12 NOTE — Assessment & Plan Note (Signed)
Chronic hypertension not at goal today. She is taking lisinopril-hctz for management. I suspect her level of agitation today may be contributing to her elevated blood pressures.  Plan: - Continue current medication - Monitor BP at home and report if readings are staying higher than 130/80 for more than 2 days in a row

## 2022-10-12 NOTE — Assessment & Plan Note (Signed)
Increased anxiety present over current situation. There are multiple factors coming into play at this time including pain, attention, depression, and possible hypomania. I do not feel that her symptoms are well controlled at this time. I discussed with her today that I feel for her best interest we need to seek the direction of psychiatry to help with further management of medications to better control her symptoms. She agrees to this today.  Plan: - Referral placed - No changes in medications aside from the addition of Vyvanse for ADHD

## 2022-10-12 NOTE — Assessment & Plan Note (Signed)
Labs today for monitoring.

## 2022-10-12 NOTE — Assessment & Plan Note (Signed)
Chronic. Diet controlled. Will monitor labs today. She may need to start statin therapy based on findings.

## 2022-10-12 NOTE — Assessment & Plan Note (Signed)
History of ADHD from childhood with previous control with Vyvanse. Today she exhibits signs of ADHD but there is also concern that bipolar disorder may also be a contributor to her symptoms. Given that she has had success in the past with ADHD medication and the severity of her symptoms, we will restart Vyvanse at 20mg  and monitor closely. Referral for psychiatry is recommended and agreed upon by patient.  Plan: - Referral to psychiatry for better management of mood and ADHD - Vyvanse restarted at 20mg 

## 2022-10-12 NOTE — Assessment & Plan Note (Signed)
Weight loss of unknown etiology reported. Unclear if medication, increased stress, or other factors are contributing. Records show in Jan. 2023 she was 226 and today she is 178, which is a significant difference. We will monitor labs today to determine if her thyroid could be contributing as she suspects or other factors are present.  Plan: - Labs today - Will consider low dose lung CT if labs normal due to smoking history

## 2022-10-19 ENCOUNTER — Other Ambulatory Visit (HOSPITAL_BASED_OUTPATIENT_CLINIC_OR_DEPARTMENT_OTHER): Payer: Self-pay | Admitting: Nurse Practitioner

## 2022-10-19 DIAGNOSIS — F319 Bipolar disorder, unspecified: Secondary | ICD-10-CM

## 2022-10-19 NOTE — Telephone Encounter (Signed)
Refill request last apt 10/05/22.

## 2022-10-22 ENCOUNTER — Ambulatory Visit
Admission: EM | Admit: 2022-10-22 | Discharge: 2022-10-22 | Disposition: A | Discharge status: Home / Self Care | Payer: 59 | Attending: Nurse Practitioner | Admitting: Nurse Practitioner

## 2022-10-22 DIAGNOSIS — T300 Burn of unspecified body region, unspecified degree: Secondary | ICD-10-CM | POA: Diagnosis not present

## 2022-10-22 MED ORDER — MUPIROCIN 2 % EX OINT: 1.0000 | TOPICAL_OINTMENT | Freq: Two times a day (BID) | CUTANEOUS | 0 refills | Status: DC

## 2022-10-22 MED ORDER — FLUCONAZOLE 150 MG PO TABS: 150.0000 mg | ORAL_TABLET | Freq: Once | ORAL | 0 refills | Status: AC

## 2022-10-22 MED ORDER — CEPHALEXIN 500 MG PO CAPS: 500.0000 mg | ORAL_CAPSULE | Freq: Four times a day (QID) | ORAL | 0 refills | Status: AC

## 2022-10-22 NOTE — ED Triage Notes (Signed)
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 Pt did put silver sulfadiazine cream on it to help.
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
## 2022-10-22 NOTE — Discharge Instructions (Signed)
Take medication as prescribed. May take over-the-counter Tylenol or ibuprofen as needed for pain, fever, general discomfort. May apply cool compresses to the areas as needed for pain or discomfort. Do not pick or disrupt the areas until they are completely healed. Continue to monitor the areas for possible signs of infection.  This includes foul-smelling drainage, swelling, redness, or warmth to touch. If symptoms do not improve with this treatment, please follow-up with your primary care physician for further evaluation. Follow-up as needed.

## 2022-11-30 ENCOUNTER — Other Ambulatory Visit: Payer: Self-pay | Admitting: Nurse Practitioner

## 2022-11-30 DIAGNOSIS — F902 Attention-deficit hyperactivity disorder, combined type: Secondary | ICD-10-CM

## 2022-11-30 NOTE — Telephone Encounter (Signed)
Pt. Already has a future order for this to this pharmacy. It would not let me refuse it.

## 2022-12-04 MED ORDER — LISDEXAMFETAMINE DIMESYLATE 20 MG PO CAPS
20.0000 mg | ORAL_CAPSULE | Freq: Every day | ORAL | 0 refills | Status: DC
Start: 2022-12-04 — End: 2023-07-31

## 2023-01-07 ENCOUNTER — Ambulatory Visit: Payer: 59 | Admitting: Nurse Practitioner

## 2023-01-09 DIAGNOSIS — F4312 Post-traumatic stress disorder, chronic: Secondary | ICD-10-CM | POA: Diagnosis not present

## 2023-01-09 DIAGNOSIS — F3112 Bipolar disorder, current episode manic without psychotic features, moderate: Secondary | ICD-10-CM | POA: Diagnosis not present

## 2023-01-20 ENCOUNTER — Other Ambulatory Visit (HOSPITAL_BASED_OUTPATIENT_CLINIC_OR_DEPARTMENT_OTHER): Payer: Self-pay | Admitting: Nurse Practitioner

## 2023-01-20 DIAGNOSIS — F319 Bipolar disorder, unspecified: Secondary | ICD-10-CM

## 2023-01-21 ENCOUNTER — Encounter: Payer: Self-pay | Admitting: Nurse Practitioner

## 2023-01-21 ENCOUNTER — Ambulatory Visit (INDEPENDENT_AMBULATORY_CARE_PROVIDER_SITE_OTHER): Payer: 59 | Admitting: Nurse Practitioner

## 2023-01-21 VITALS — BP 132/84 | HR 85 | Wt 169.4 lb

## 2023-01-21 DIAGNOSIS — F902 Attention-deficit hyperactivity disorder, combined type: Secondary | ICD-10-CM | POA: Diagnosis not present

## 2023-01-21 DIAGNOSIS — F319 Bipolar disorder, unspecified: Secondary | ICD-10-CM

## 2023-01-21 DIAGNOSIS — I10 Essential (primary) hypertension: Secondary | ICD-10-CM | POA: Diagnosis not present

## 2023-01-21 DIAGNOSIS — E039 Hypothyroidism, unspecified: Secondary | ICD-10-CM | POA: Diagnosis not present

## 2023-01-21 DIAGNOSIS — R1084 Generalized abdominal pain: Secondary | ICD-10-CM | POA: Diagnosis not present

## 2023-01-21 DIAGNOSIS — T304 Corrosion of unspecified body region, unspecified degree: Secondary | ICD-10-CM | POA: Diagnosis not present

## 2023-01-21 MED ORDER — MUPIROCIN 2 % EX OINT: 1.0000 | TOPICAL_OINTMENT | Freq: Two times a day (BID) | CUTANEOUS | 0 refills | Status: DC

## 2023-01-21 NOTE — Patient Instructions (Signed)
I will check to see if there is an allergy present against the milk proteins or meat proteins.   Do not pick the wounds, this will keep them from healing as quickly and can cause infection. I have sent the referral to dermatology for you and the mupirocin in. Keep applying the mupirocin daily and cover the wounds so you wont pick them.   I want you to speak to your psychiatrist about the picking and obsessive eating behaviors, anxiety, and ADHD. I think he can help with best management. I don't know what we can safely use with the spravato, so him managing all of this would be safest.
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
## 2023-01-21 NOTE — Telephone Encounter (Signed)
Refill request has apt. Today

## 2023-01-22 ENCOUNTER — Telehealth: Payer: Self-pay | Admitting: Nurse Practitioner

## 2023-01-22 DIAGNOSIS — F332 Major depressive disorder, recurrent severe without psychotic features: Secondary | ICD-10-CM | POA: Diagnosis not present

## 2023-01-22 DIAGNOSIS — F319 Bipolar disorder, unspecified: Secondary | ICD-10-CM

## 2023-01-22 DIAGNOSIS — F4312 Post-traumatic stress disorder, chronic: Secondary | ICD-10-CM | POA: Diagnosis not present

## 2023-01-22 LAB — TSH: TSH: 1.76 u[IU]/mL (ref 0.450–4.500)

## 2023-01-22 MED ORDER — CLONAZEPAM 0.5 MG PO TABS
0.5000 mg | ORAL_TABLET | Freq: Two times a day (BID) | ORAL | 0 refills | Status: AC | PRN
Start: 2023-01-22 — End: ?

## 2023-01-22 NOTE — Addendum Note (Signed)
Addended by: Meah Jiron, Huntley Dec E on: 01/22/2023 03:06 PM   Modules accepted: Orders

## 2023-01-22 NOTE — Telephone Encounter (Signed)
Pt called and says Huntley Dec was supposed to send a antibiotic in and klonopin. She uses  [id number] - [place], [place] - [id number]

## 2023-01-28 DIAGNOSIS — T304 Corrosion of unspecified body region, unspecified degree: Secondary | ICD-10-CM | POA: Insufficient documentation

## 2023-01-28 NOTE — Assessment & Plan Note (Signed)
Patient appears to be in manic state at this time with rapid, pressured speech, hyperactivity, compulsive behaviors, and difficulty with maintaining a topic. Her visit is limited today due to this being an acute visit. She is seeing psychiatry and given the complex nature of her condition and current state, I feel that it is best to defer medication management to their care. I do not feel that it is a safe option for me to continue with her psychiatric medications given that I do not have immediate access to their notes or plan of care. I do feel that adjustment is needed to her medications to better help control her current status. I will defer bipolar, anxiety, and ADHD medication management to psychiatry for complete evaluation and recommendations.
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
## 2023-01-28 NOTE — Assessment & Plan Note (Signed)
BP is elevated in the office today. It is unclear if this is related to hyperactivity. I recommend monitoring at home when calm and reporting if readings are higher than 130/80 on a regular basis.

## 2023-01-28 NOTE — Assessment & Plan Note (Signed)
She is requesting an increase in dosage. At this time, I do not feel that further management is appropriate in the primary care setting with her other mental health concerns appearing to be not well controlled. I will plan to continue the vyvanse at the current dose, but I would like for her to speak to psychiatry about medication changes and recommendations going forward. I do not feel that it is safe for her to have management in two locations and want to ensure that we are providing the safest and best care possible to help manage all of her needs.

## 2023-01-28 NOTE — Assessment & Plan Note (Signed)
Labs pending. Will make changes as necessary.

## 2023-02-01 LAB — ALPHA GALACTOSIDASE: Alpha-Galactosidase activity: 22.9 nmol/h/mg{prot} — ABNORMAL LOW (ref >35.5)

## 2023-02-01 LAB — IGE MILK W/ COMPONENT REFLEX: F002-IgE Milk: <0.1 kU/L

## 2023-02-06 DIAGNOSIS — F332 Major depressive disorder, recurrent severe without psychotic features: Secondary | ICD-10-CM | POA: Diagnosis not present

## 2023-02-06 DIAGNOSIS — F4312 Post-traumatic stress disorder, chronic: Secondary | ICD-10-CM | POA: Diagnosis not present

## 2023-02-07 DIAGNOSIS — F3112 Bipolar disorder, current episode manic without psychotic features, moderate: Secondary | ICD-10-CM | POA: Diagnosis not present

## 2023-02-07 DIAGNOSIS — F4312 Post-traumatic stress disorder, chronic: Secondary | ICD-10-CM | POA: Diagnosis not present

## 2023-02-07 DIAGNOSIS — F902 Attention-deficit hyperactivity disorder, combined type: Secondary | ICD-10-CM | POA: Diagnosis not present

## 2023-02-08 DIAGNOSIS — F332 Major depressive disorder, recurrent severe without psychotic features: Secondary | ICD-10-CM | POA: Diagnosis not present

## 2023-02-08 DIAGNOSIS — F902 Attention-deficit hyperactivity disorder, combined type: Secondary | ICD-10-CM | POA: Diagnosis not present

## 2023-02-13 DIAGNOSIS — F332 Major depressive disorder, recurrent severe without psychotic features: Secondary | ICD-10-CM | POA: Diagnosis not present

## 2023-02-19 DIAGNOSIS — F411 Generalized anxiety disorder: Secondary | ICD-10-CM | POA: Diagnosis not present

## 2023-02-19 DIAGNOSIS — F902 Attention-deficit hyperactivity disorder, combined type: Secondary | ICD-10-CM | POA: Diagnosis not present

## 2023-02-19 DIAGNOSIS — F3112 Bipolar disorder, current episode manic without psychotic features, moderate: Secondary | ICD-10-CM | POA: Diagnosis not present

## 2023-02-20 DIAGNOSIS — F332 Major depressive disorder, recurrent severe without psychotic features: Secondary | ICD-10-CM | POA: Diagnosis not present

## 2023-03-01 DIAGNOSIS — F332 Major depressive disorder, recurrent severe without psychotic features: Secondary | ICD-10-CM | POA: Diagnosis not present

## 2023-03-05 ENCOUNTER — Other Ambulatory Visit (HOSPITAL_BASED_OUTPATIENT_CLINIC_OR_DEPARTMENT_OTHER): Payer: Self-pay | Admitting: Nurse Practitioner

## 2023-03-05 DIAGNOSIS — F331 Major depressive disorder, recurrent, moderate: Secondary | ICD-10-CM

## 2023-03-21 DIAGNOSIS — F411 Generalized anxiety disorder: Secondary | ICD-10-CM | POA: Diagnosis not present

## 2023-03-21 DIAGNOSIS — F4312 Post-traumatic stress disorder, chronic: Secondary | ICD-10-CM | POA: Diagnosis not present

## 2023-03-21 DIAGNOSIS — F332 Major depressive disorder, recurrent severe without psychotic features: Secondary | ICD-10-CM | POA: Diagnosis not present

## 2023-04-02 ENCOUNTER — Other Ambulatory Visit: Payer: Self-pay | Admitting: Nurse Practitioner

## 2023-04-02 DIAGNOSIS — D509 Iron deficiency anemia, unspecified: Secondary | ICD-10-CM

## 2023-04-14 ENCOUNTER — Other Ambulatory Visit (HOSPITAL_BASED_OUTPATIENT_CLINIC_OR_DEPARTMENT_OTHER): Payer: Self-pay | Admitting: Nurse Practitioner

## 2023-04-14 DIAGNOSIS — I10 Essential (primary) hypertension: Secondary | ICD-10-CM

## 2023-04-19 ENCOUNTER — Other Ambulatory Visit (HOSPITAL_BASED_OUTPATIENT_CLINIC_OR_DEPARTMENT_OTHER): Payer: Self-pay | Admitting: Nurse Practitioner

## 2023-04-19 DIAGNOSIS — F419 Anxiety disorder, unspecified: Secondary | ICD-10-CM

## 2023-04-19 DIAGNOSIS — F319 Bipolar disorder, unspecified: Secondary | ICD-10-CM

## 2023-05-15 DIAGNOSIS — F332 Major depressive disorder, recurrent severe without psychotic features: Secondary | ICD-10-CM | POA: Diagnosis not present

## 2023-05-17 DIAGNOSIS — F3132 Bipolar disorder, current episode depressed, moderate: Secondary | ICD-10-CM | POA: Diagnosis not present

## 2023-05-20 DIAGNOSIS — F4312 Post-traumatic stress disorder, chronic: Secondary | ICD-10-CM | POA: Diagnosis not present

## 2023-05-20 DIAGNOSIS — F902 Attention-deficit hyperactivity disorder, combined type: Secondary | ICD-10-CM | POA: Diagnosis not present

## 2023-05-20 DIAGNOSIS — F319 Bipolar disorder, unspecified: Secondary | ICD-10-CM | POA: Diagnosis not present

## 2023-05-22 DIAGNOSIS — F332 Major depressive disorder, recurrent severe without psychotic features: Secondary | ICD-10-CM | POA: Diagnosis not present

## 2023-05-24 DIAGNOSIS — F332 Major depressive disorder, recurrent severe without psychotic features: Secondary | ICD-10-CM | POA: Diagnosis not present

## 2023-05-27 DIAGNOSIS — F332 Major depressive disorder, recurrent severe without psychotic features: Secondary | ICD-10-CM | POA: Diagnosis not present

## 2023-05-30 ENCOUNTER — Other Ambulatory Visit (HOSPITAL_BASED_OUTPATIENT_CLINIC_OR_DEPARTMENT_OTHER): Payer: Self-pay | Admitting: Nurse Practitioner

## 2023-05-30 DIAGNOSIS — F411 Generalized anxiety disorder: Secondary | ICD-10-CM

## 2023-05-30 DIAGNOSIS — R110 Nausea: Secondary | ICD-10-CM

## 2023-05-31 DIAGNOSIS — F332 Major depressive disorder, recurrent severe without psychotic features: Secondary | ICD-10-CM | POA: Diagnosis not present

## 2023-06-03 DIAGNOSIS — F3132 Bipolar disorder, current episode depressed, moderate: Secondary | ICD-10-CM | POA: Diagnosis not present

## 2023-06-07 ENCOUNTER — Telehealth: Payer: 59 | Admitting: Family Medicine

## 2023-06-07 DIAGNOSIS — J069 Acute upper respiratory infection, unspecified: Secondary | ICD-10-CM | POA: Diagnosis not present

## 2023-06-07 DIAGNOSIS — F3132 Bipolar disorder, current episode depressed, moderate: Secondary | ICD-10-CM | POA: Diagnosis not present

## 2023-06-07 MED ORDER — PREDNISONE 20 MG PO TABS: 20.0000 mg | ORAL_TABLET | Freq: Two times a day (BID) | ORAL | 0 refills | Status: AC

## 2023-06-07 NOTE — Progress Notes (Signed)

## 2023-06-10 DIAGNOSIS — F332 Major depressive disorder, recurrent severe without psychotic features: Secondary | ICD-10-CM | POA: Diagnosis not present

## 2023-06-14 DIAGNOSIS — F332 Major depressive disorder, recurrent severe without psychotic features: Secondary | ICD-10-CM | POA: Diagnosis not present

## 2023-06-17 DIAGNOSIS — F3132 Bipolar disorder, current episode depressed, moderate: Secondary | ICD-10-CM | POA: Diagnosis not present

## 2023-06-21 DIAGNOSIS — F4312 Post-traumatic stress disorder, chronic: Secondary | ICD-10-CM | POA: Diagnosis not present

## 2023-06-21 DIAGNOSIS — F319 Bipolar disorder, unspecified: Secondary | ICD-10-CM | POA: Diagnosis not present

## 2023-06-21 DIAGNOSIS — F332 Major depressive disorder, recurrent severe without psychotic features: Secondary | ICD-10-CM | POA: Diagnosis not present

## 2023-06-21 DIAGNOSIS — F411 Generalized anxiety disorder: Secondary | ICD-10-CM | POA: Diagnosis not present

## 2023-06-21 DIAGNOSIS — F902 Attention-deficit hyperactivity disorder, combined type: Secondary | ICD-10-CM | POA: Diagnosis not present

## 2023-06-24 DIAGNOSIS — F332 Major depressive disorder, recurrent severe without psychotic features: Secondary | ICD-10-CM | POA: Diagnosis not present

## 2023-06-27 DIAGNOSIS — F332 Major depressive disorder, recurrent severe without psychotic features: Secondary | ICD-10-CM | POA: Diagnosis not present

## 2023-07-01 DIAGNOSIS — F4312 Post-traumatic stress disorder, chronic: Secondary | ICD-10-CM | POA: Diagnosis not present

## 2023-07-01 DIAGNOSIS — F332 Major depressive disorder, recurrent severe without psychotic features: Secondary | ICD-10-CM | POA: Diagnosis not present

## 2023-07-01 DIAGNOSIS — F411 Generalized anxiety disorder: Secondary | ICD-10-CM | POA: Diagnosis not present

## 2023-07-03 DIAGNOSIS — F332 Major depressive disorder, recurrent severe without psychotic features: Secondary | ICD-10-CM | POA: Diagnosis not present

## 2023-07-05 DIAGNOSIS — F332 Major depressive disorder, recurrent severe without psychotic features: Secondary | ICD-10-CM | POA: Diagnosis not present

## 2023-07-08 DIAGNOSIS — F411 Generalized anxiety disorder: Secondary | ICD-10-CM | POA: Diagnosis not present

## 2023-07-08 DIAGNOSIS — F4312 Post-traumatic stress disorder, chronic: Secondary | ICD-10-CM | POA: Diagnosis not present

## 2023-07-08 DIAGNOSIS — F332 Major depressive disorder, recurrent severe without psychotic features: Secondary | ICD-10-CM | POA: Diagnosis not present

## 2023-07-10 DIAGNOSIS — F332 Major depressive disorder, recurrent severe without psychotic features: Secondary | ICD-10-CM | POA: Diagnosis not present

## 2023-07-12 ENCOUNTER — Other Ambulatory Visit (HOSPITAL_BASED_OUTPATIENT_CLINIC_OR_DEPARTMENT_OTHER): Payer: Self-pay | Admitting: Nurse Practitioner

## 2023-07-12 DIAGNOSIS — I10 Essential (primary) hypertension: Secondary | ICD-10-CM

## 2023-07-12 DIAGNOSIS — F332 Major depressive disorder, recurrent severe without psychotic features: Secondary | ICD-10-CM | POA: Diagnosis not present

## 2023-07-15 DIAGNOSIS — F332 Major depressive disorder, recurrent severe without psychotic features: Secondary | ICD-10-CM | POA: Diagnosis not present

## 2023-07-16 DIAGNOSIS — F411 Generalized anxiety disorder: Secondary | ICD-10-CM | POA: Diagnosis not present

## 2023-07-16 DIAGNOSIS — F332 Major depressive disorder, recurrent severe without psychotic features: Secondary | ICD-10-CM | POA: Diagnosis not present

## 2023-07-16 DIAGNOSIS — F4312 Post-traumatic stress disorder, chronic: Secondary | ICD-10-CM | POA: Diagnosis not present

## 2023-07-18 DIAGNOSIS — F332 Major depressive disorder, recurrent severe without psychotic features: Secondary | ICD-10-CM | POA: Diagnosis not present

## 2023-07-23 DIAGNOSIS — F332 Major depressive disorder, recurrent severe without psychotic features: Secondary | ICD-10-CM | POA: Diagnosis not present

## 2023-07-25 ENCOUNTER — Other Ambulatory Visit (HOSPITAL_BASED_OUTPATIENT_CLINIC_OR_DEPARTMENT_OTHER): Payer: Self-pay | Admitting: Nurse Practitioner

## 2023-07-25 ENCOUNTER — Ambulatory Visit: Payer: 59 | Admitting: Nurse Practitioner

## 2023-07-25 DIAGNOSIS — F419 Anxiety disorder, unspecified: Secondary | ICD-10-CM

## 2023-07-25 DIAGNOSIS — F319 Bipolar disorder, unspecified: Secondary | ICD-10-CM

## 2023-07-25 NOTE — Telephone Encounter (Signed)
She was supposed to come in today but did not show up.

## 2023-07-25 NOTE — Progress Notes (Signed)
 Patient not seen.

## 2023-07-26 DIAGNOSIS — F4312 Post-traumatic stress disorder, chronic: Secondary | ICD-10-CM | POA: Diagnosis not present

## 2023-07-26 DIAGNOSIS — F332 Major depressive disorder, recurrent severe without psychotic features: Secondary | ICD-10-CM | POA: Diagnosis not present

## 2023-07-26 DIAGNOSIS — F411 Generalized anxiety disorder: Secondary | ICD-10-CM | POA: Diagnosis not present

## 2023-07-30 DIAGNOSIS — F411 Generalized anxiety disorder: Secondary | ICD-10-CM | POA: Diagnosis not present

## 2023-07-30 DIAGNOSIS — F332 Major depressive disorder, recurrent severe without psychotic features: Secondary | ICD-10-CM | POA: Diagnosis not present

## 2023-07-30 DIAGNOSIS — F4312 Post-traumatic stress disorder, chronic: Secondary | ICD-10-CM | POA: Diagnosis not present

## 2023-07-31 ENCOUNTER — Telehealth: Payer: Self-pay

## 2023-07-31 DIAGNOSIS — F419 Anxiety disorder, unspecified: Secondary | ICD-10-CM

## 2023-07-31 DIAGNOSIS — F332 Major depressive disorder, recurrent severe without psychotic features: Secondary | ICD-10-CM | POA: Diagnosis not present

## 2023-07-31 DIAGNOSIS — F319 Bipolar disorder, unspecified: Secondary | ICD-10-CM

## 2023-07-31 MED ORDER — FLUOXETINE HCL 40 MG PO CAPS: ORAL_CAPSULE | ORAL | Status: AC

## 2023-07-31 NOTE — Telephone Encounter (Signed)
 Pharmacy aware to disregard the BID script from you and fill the one from the other provider that is TID.

## 2023-07-31 NOTE — Telephone Encounter (Signed)
 Copied from CRM [id number]. Topic: Clinical - Prescription Issue >> Jul 31, 2023 11:28 AM Martha Clan wrote: Reason for CRM: Prescription sent in today for fluoxitine for 1 twice daily by [facility], but patient is getting prescription from [facility] for 1, 3 times a day. Pharmacy needs information on which is correct.   Pharmacy call back: [phone] Fax: [phone]  They are asking if the above medication fluoxetine is supposed to be twice a day or three times a day.

## 2023-07-31 NOTE — Telephone Encounter (Signed)
 Please let the pharmacy know to disregard the order from me (BID dose) and continue with the order from [name] (TID dose). Patient is due for follow-up, but courtesy refill provided for 30 days. It sounds like she is now getting her medication from psychiatry.

## 2023-08-02 DIAGNOSIS — F3132 Bipolar disorder, current episode depressed, moderate: Secondary | ICD-10-CM | POA: Diagnosis not present

## 2023-08-03 DIAGNOSIS — F332 Major depressive disorder, recurrent severe without psychotic features: Secondary | ICD-10-CM | POA: Diagnosis not present

## 2023-08-03 DIAGNOSIS — F4312 Post-traumatic stress disorder, chronic: Secondary | ICD-10-CM | POA: Diagnosis not present

## 2023-08-03 DIAGNOSIS — F411 Generalized anxiety disorder: Secondary | ICD-10-CM | POA: Diagnosis not present

## 2023-08-05 DIAGNOSIS — F332 Major depressive disorder, recurrent severe without psychotic features: Secondary | ICD-10-CM | POA: Diagnosis not present

## 2023-08-07 DIAGNOSIS — F3132 Bipolar disorder, current episode depressed, moderate: Secondary | ICD-10-CM | POA: Diagnosis not present

## 2023-08-12 DIAGNOSIS — F332 Major depressive disorder, recurrent severe without psychotic features: Secondary | ICD-10-CM | POA: Diagnosis not present

## 2023-08-13 DIAGNOSIS — F411 Generalized anxiety disorder: Secondary | ICD-10-CM | POA: Diagnosis not present

## 2023-08-13 DIAGNOSIS — F4312 Post-traumatic stress disorder, chronic: Secondary | ICD-10-CM | POA: Diagnosis not present

## 2023-08-13 DIAGNOSIS — F332 Major depressive disorder, recurrent severe without psychotic features: Secondary | ICD-10-CM | POA: Diagnosis not present

## 2023-08-14 DIAGNOSIS — F332 Major depressive disorder, recurrent severe without psychotic features: Secondary | ICD-10-CM | POA: Diagnosis not present

## 2023-08-15 ENCOUNTER — Other Ambulatory Visit (HOSPITAL_BASED_OUTPATIENT_CLINIC_OR_DEPARTMENT_OTHER): Payer: Self-pay | Admitting: Nurse Practitioner

## 2023-08-15 DIAGNOSIS — I10 Essential (primary) hypertension: Secondary | ICD-10-CM

## 2023-08-16 DIAGNOSIS — F902 Attention-deficit hyperactivity disorder, combined type: Secondary | ICD-10-CM | POA: Diagnosis not present

## 2023-08-16 DIAGNOSIS — F411 Generalized anxiety disorder: Secondary | ICD-10-CM | POA: Diagnosis not present

## 2023-08-16 DIAGNOSIS — F332 Major depressive disorder, recurrent severe without psychotic features: Secondary | ICD-10-CM | POA: Diagnosis not present

## 2023-08-16 DIAGNOSIS — F4312 Post-traumatic stress disorder, chronic: Secondary | ICD-10-CM | POA: Diagnosis not present

## 2023-08-19 DIAGNOSIS — F3132 Bipolar disorder, current episode depressed, moderate: Secondary | ICD-10-CM | POA: Diagnosis not present

## 2023-08-21 DIAGNOSIS — F3132 Bipolar disorder, current episode depressed, moderate: Secondary | ICD-10-CM | POA: Diagnosis not present

## 2023-08-23 DIAGNOSIS — F4312 Post-traumatic stress disorder, chronic: Secondary | ICD-10-CM | POA: Diagnosis not present

## 2023-08-23 DIAGNOSIS — F332 Major depressive disorder, recurrent severe without psychotic features: Secondary | ICD-10-CM | POA: Diagnosis not present

## 2023-08-23 DIAGNOSIS — F411 Generalized anxiety disorder: Secondary | ICD-10-CM | POA: Diagnosis not present

## 2023-08-26 DIAGNOSIS — F332 Major depressive disorder, recurrent severe without psychotic features: Secondary | ICD-10-CM | POA: Diagnosis not present

## 2023-08-28 DIAGNOSIS — F332 Major depressive disorder, recurrent severe without psychotic features: Secondary | ICD-10-CM | POA: Diagnosis not present

## 2023-08-30 DIAGNOSIS — F332 Major depressive disorder, recurrent severe without psychotic features: Secondary | ICD-10-CM | POA: Diagnosis not present

## 2023-08-30 DIAGNOSIS — F4312 Post-traumatic stress disorder, chronic: Secondary | ICD-10-CM | POA: Diagnosis not present

## 2023-08-30 DIAGNOSIS — F411 Generalized anxiety disorder: Secondary | ICD-10-CM | POA: Diagnosis not present

## 2023-09-02 DIAGNOSIS — F332 Major depressive disorder, recurrent severe without psychotic features: Secondary | ICD-10-CM | POA: Diagnosis not present

## 2023-09-04 DIAGNOSIS — F3112 Bipolar disorder, current episode manic without psychotic features, moderate: Secondary | ICD-10-CM | POA: Diagnosis not present

## 2023-09-09 DIAGNOSIS — F3132 Bipolar disorder, current episode depressed, moderate: Secondary | ICD-10-CM | POA: Diagnosis not present

## 2023-09-11 ENCOUNTER — Telehealth: Admitting: Physician Assistant

## 2023-09-11 DIAGNOSIS — R6889 Other general symptoms and signs: Secondary | ICD-10-CM | POA: Diagnosis not present

## 2023-09-11 MED ORDER — PROMETHAZINE-DM 6.25-15 MG/5ML PO SYRP: 5.0000 mL | ORAL_SOLUTION | Freq: Four times a day (QID) | ORAL | 0 refills | Status: DC | PRN

## 2023-09-11 MED ORDER — OSELTAMIVIR PHOSPHATE 75 MG PO CAPS: 75.0000 mg | ORAL_CAPSULE | Freq: Two times a day (BID) | ORAL | 0 refills | Status: DC

## 2023-09-11 MED ORDER — ONDANSETRON 4 MG PO TBDP: 4.0000 mg | ORAL_TABLET | Freq: Three times a day (TID) | ORAL | 0 refills | Status: DC | PRN

## 2023-09-11 NOTE — Patient Instructions (Signed)
 Drue Stager, thank you for joining Margaretann Loveless, PA-C for today's virtual visit.  While this provider is not your primary care provider (PCP), if your PCP is located in our provider database this encounter information will be shared with them immediately following your visit.   A [facility] MyChart account gives you access to today's visit and all your visits, tests, and labs performed at [facility] " click here if you don't have a  MyChart account or go to [email or web address]  Consent: (Patient) Sarah Espinoza provided verbal consent for this virtual visit at the beginning of the encounter.  Current Medications:  Current Outpatient Medications:    ondansetron (ZOFRAN-ODT) 4 MG disintegrating tablet, Take 1 tablet (4 mg total) by mouth every 8 (eight) hours as needed., Disp: 20 tablet, Rfl: 0   oseltamivir (TAMIFLU) 75 MG capsule, Take 1 capsule (75 mg total) by mouth 2 (two) times daily., Disp: 10 capsule, Rfl: 0   promethazine-dextromethorphan (PROMETHAZINE-DM) 6.25-15 MG/5ML syrup, Take 5 mLs by mouth 4 (four) times daily as needed., Disp: 118 mL, Rfl: 0   albuterol (VENTOLIN HFA) 108 (90 Base) MCG/ACT inhaler, Inhale 1-2 puffs into the lungs every 6 (six) hours as needed for wheezing or shortness of breath., Disp: 18 g, Rfl: 0   albuterol (VENTOLIN HFA) 108 (90 Base) MCG/ACT inhaler, Inhale 2 puffs into the lungs every 4 (four) hours as needed for wheezing or shortness of breath., Disp: 18 g, Rfl: 0   celecoxib (CELEBREX) 200 MG capsule, Take 1 capsule (200 mg total) by mouth 2 (two) times daily., Disp: 180 capsule, Rfl: 3   clonazePAM (KLONOPIN) 0.5 MG tablet, Take 1 tablet (0.5 mg total) by mouth 2 (two) times daily as needed., Disp: 60 tablet, Rfl: 0   ferrous sulfate (FEROSUL) 325 (65 FE) MG tablet, TAKE 1 TABLET BY MOUTH EVERY OTHER DAY. TAKING WITH FOOD AND ORANGE JUICE CAN HELP WITH ABSORPTION., Disp: 45 tablet, Rfl: 1   FLUoxetine  (PROZAC) 40 MG capsule, Pharmacy reports 40mg  TID prescribed by [name]., Disp: , Rfl:    gabapentin (NEURONTIN) 600 MG tablet, TAKE 1 TABLET BY MOUTH THREE TIMES DAILY, Disp: 270 tablet, Rfl: 0   levothyroxine (SYNTHROID) 25 MCG tablet, Take 1 tablet (25 mcg total) by mouth daily., Disp: 90 tablet, Rfl: 3   lisinopril-hydrochlorothiazide (ZESTORETIC) 20-25 MG tablet, Take 1 tablet by mouth once daily, Disp: 30 tablet, Rfl: 1   Oxcarbazepine (TRILEPTAL) 300 MG tablet, Take 2 tablets (600 mg total) by mouth daily., Disp: 180 tablet, Rfl: 3   Medications ordered in this encounter:  Meds ordered this encounter  Medications   oseltamivir (TAMIFLU) 75 MG capsule    Sig: Take 1 capsule (75 mg total) by mouth 2 (two) times daily.    Dispense:  10 capsule    Refill:  0    Supervising Provider:   Merrilee Jansky [[id number]]   promethazine-dextromethorphan (PROMETHAZINE-DM) 6.25-15 MG/5ML syrup    Sig: Take 5 mLs by mouth 4 (four) times daily as needed.    Dispense:  118 mL    Refill:  0    Supervising Provider:   LAMPTEY, PHILIP O [[id number]]   ondansetron (ZOFRAN-ODT) 4 MG disintegrating tablet    Sig: Take 1 tablet (4 mg total) by mouth every 8 (eight) hours as needed.    Dispense:  20 tablet    Refill:  0    Supervising Provider:   Merrilee Jansky [id number]     *If  you need refills on other medications prior to your next appointment, please contact your pharmacy*  Follow-Up: Call back or seek an in-person evaluation if the symptoms worsen or if the condition fails to improve as anticipated.  [facility] [phone]  Other Instructions Influenza, Adult Influenza is also called the flu. It's an infection that affects your respiratory tract. This includes your nose, throat, windpipe, and lungs. The flu is contagious. This means it spreads easily from person to person. It causes symptoms that are like a cold. It can also cause a high fever and body aches. What are the  causes? The flu is caused by the influenza virus. You can get it by: Breathing in droplets that are in the air after an infected person coughs or sneezes. Touching something that has the virus on it and then touching your mouth, nose, or eyes. What increases the risk? You may be more likely to get the flu if: You don't wash your hands often. You're near a lot of people during cold and flu season. You touch your mouth, eyes, or nose without washing your hands first. You don't get a flu shot each year. You may also be more at risk for the flu and serious problems, such as a lung infection called pneumonia, if: You're older than 65. You're pregnant. Your immune system is weak. Your immune system is your body's defense system. You have a long-term, or chronic, condition, such as: Heart, kidney, or lung disease. Diabetes. A liver disorder. Asthma. You're very overweight. You have anemia. This is when you don't have enough red blood cells in your body. What are the signs or symptoms? Flu symptoms often start all of a sudden. They may last 4-14 days and include: Fever and chills. Headaches, body aches, or muscle aches. Sore throat. Cough. Runny or stuffy nose. Discomfort in your chest. Not wanting to eat as much as normal. Feeling weak or tired. Feeling dizzy. Nausea or vomiting. How is this diagnosed? The flu may be diagnosed based on your symptoms and medical history. You may also have a physical exam. A swab may be taken from your nose or throat and tested for the virus. How is this treated? If the flu is found early, you can be treated with antiviral medicine. This may be given to you by mouth or through an IV. It can help you feel less sick and get better faster. Taking care of yourself at home can also help your symptoms get better. Your health care provider may tell you to: Take over-the-counter medicines. Drink lots of fluids. The flu often goes away on its own. If you have  very bad symptoms or problems caused by the flu, you may need to be treated in a hospital. Follow these instructions at home: Activity Rest as needed. Get lots of sleep. Stay home from work or school as told by your provider. Leave home only to go see your provider. Do not leave home for other reasons until you don't have a fever for 24 hours without taking medicine. Eating and drinking Take an oral rehydration solution (ORS). This is a drink that is sold at pharmacies and stores. Drink enough fluid to keep your pee pale yellow. Try to drink small amounts of clear fluids. These include water, ice chips, fruit juice mixed with water, and low-calorie sports drinks. Try to eat bland foods that are easy to digest. These include bananas, applesauce, rice, lean meats, toast, and crackers. Avoid drinks that have  a lot of sugar or caffeine in them. These include energy drinks, regular sports drinks, and soda. Do not drink alcohol. Do not eat spicy or fatty foods. General instructions     Take your medicines only as told by your provider. Use a cool mist humidifier to add moisture to the air in your home. This can make it easier for you to breathe. You should also clean the humidifier every day. To do so: Empty the water. Pour clean water in. Cover your mouth and nose when you cough or sneeze. Wash your hands with soap and water often and for at least 20 seconds. It's extra important to do so after you cough or sneeze. If you can't use soap and water, use hand sanitizer. How is this prevented?  Get a flu shot every year. Ask your provider when you should get your flu shot. Stay away from people who are sick during fall and winter. Fall and winter are cold and flu season. Contact a health care provider if: You get new symptoms. You have chest pain. You have watery poop, also called diarrhea. You have a fever. Your cough gets worse. You start to have more mucus. You feel like you may vomit,  or you vomit. Get help right away if: You become short of breath or have trouble breathing. Your skin or nails turn blue. You have very bad pain or stiffness in your neck. You get a sudden headache or pain in your face or ear. You vomit each time you eat or drink. These symptoms may be an emergency. Call 911 right away. Do not wait to see if the symptoms will go away. Do not drive yourself to the hospital. This information is not intended to replace advice given to you by your health care provider. Make sure you discuss any questions you have with your health care provider. Document Revised: 02/21/2023 Document Reviewed: 06/28/2022 Elsevier Patient Education  2024 Elsevier Inc.   If you have been instructed to have an in-person evaluation today at a local Urgent Care facility, please use the link below. It will take you to a list of all of our available [facility], including address, phone number and hours of operation. Please do not delay care.  [facility]  If you or a family member do not have a primary care provider, use the link below to schedule a visit and establish care. When you choose a [facility] primary care physician or advanced practice provider, you gain a long-term partner in health. Find a Primary Care Provider  Learn more about [facility]'s in-office and virtual care options: [facility] - Get Care Now

## 2023-09-11 NOTE — Progress Notes (Signed)
 Virtual Visit Consent   Sarah Espinoza, you are scheduled for a virtual visit with a [facility] provider today. Just as with appointments in the office, your consent must be obtained to participate. Your consent will be active for this visit and any virtual visit you may have with one of our providers in the next 365 days. If you have a MyChart account, a copy of this consent can be sent to you electronically.  As this is a virtual visit, video technology does not allow for your provider to perform a traditional examination. This may limit your provider's ability to fully assess your condition. If your provider identifies any concerns that need to be evaluated in person or the need to arrange testing (such as labs, EKG, etc.), we will make arrangements to do so. Although advances in technology are sophisticated, we cannot ensure that it will always work on either your end or our end. If the connection with a video visit is poor, the visit may have to be switched to a telephone visit. With either a video or telephone visit, we are not always able to ensure that we have a secure connection.  By engaging in this virtual visit, you consent to the provision of healthcare and authorize for your insurance to be billed (if applicable) for the services provided during this visit. Depending on your insurance coverage, you may receive a charge related to this service.  I need to obtain your verbal consent now. Are you willing to proceed with your visit today? Sarah Espinoza has provided verbal consent on 09/11/2023 for a virtual visit (video or telephone). Margaretann Loveless, PA-C  Date: 09/11/2023 4:39 PM   Virtual Visit via Video Note   I, Margaretann Loveless, connected with  Sarah Espinoza  ([id number], Jun 06, 1975) on 09/11/23 at  4:30 PM EDT by a video-enabled telemedicine application and verified that I am speaking with the correct person using two identifiers.  Location: Patient: Virtual Visit  Location Patient: Home Provider: Virtual Visit Location Provider: Home Office   I discussed the limitations of evaluation and management by telemedicine and the availability of in person appointments. The patient expressed understanding and agreed to proceed.    History of Present Illness: Sarah Espinoza is a 48 y.o. who identifies as a female who was assigned female at birth, and is being seen today for Flu-like symptoms.  HPI: URI  This is a new problem. The current episode started yesterday. The problem has been gradually worsening. Maximum temperature: subjective low grade. Associated symptoms include congestion, coughing (productive of lighter green mucus), ear pain (bilateral), headaches, nausea, a plugged ear sensation (bilateral), rhinorrhea, sinus pain, sneezing, a sore throat and wheezing. Pertinent negatives include no diarrhea or vomiting. Associated symptoms comments: Chills, general malaise, fatigue, body aches. She has tried nothing for the symptoms. The treatment provided no relief.    Has had flu exposure at work.  Problems:  Patient Active Problem List   Diagnosis Date Noted   Burns by, chemical 01/28/2023   Attention deficit hyperactivity disorder (ADHD), combined type 10/12/2022   Joint pain 10/12/2022   Losing weight 10/05/2022   Gastroesophageal reflux disease 08/29/2021   Generalized anxiety disorder 08/29/2021   Hyperlipidemia 08/29/2021   Moderate recurrent major depression (HCC) 08/29/2021   Other seasonal allergic rhinitis 08/29/2021   Parent/child conflict 08/29/2021   Sleep disturbance 08/29/2021   Tobacco abuse 08/29/2021   Vitamin D deficiency 07/04/2021   Hypothyroidism 07/04/2021   Bipolar disorder (  HCC) 07/04/2021   Primary hypertension 07/04/2021   Heart murmur 07/04/2021    Allergies:  Allergies  Allergen Reactions   Ichthammol Other (See Comments)    Burned her skin   Medications:  Current Outpatient Medications:    ondansetron  (ZOFRAN-ODT) 4 MG disintegrating tablet, Take 1 tablet (4 mg total) by mouth every 8 (eight) hours as needed., Disp: 20 tablet, Rfl: 0   oseltamivir (TAMIFLU) 75 MG capsule, Take 1 capsule (75 mg total) by mouth 2 (two) times daily., Disp: 10 capsule, Rfl: 0   promethazine-dextromethorphan (PROMETHAZINE-DM) 6.25-15 MG/5ML syrup, Take 5 mLs by mouth 4 (four) times daily as needed., Disp: 118 mL, Rfl: 0   albuterol (VENTOLIN HFA) 108 (90 Base) MCG/ACT inhaler, Inhale 1-2 puffs into the lungs every 6 (six) hours as needed for wheezing or shortness of breath., Disp: 18 g, Rfl: 0   albuterol (VENTOLIN HFA) 108 (90 Base) MCG/ACT inhaler, Inhale 2 puffs into the lungs every 4 (four) hours as needed for wheezing or shortness of breath., Disp: 18 g, Rfl: 0   celecoxib (CELEBREX) 200 MG capsule, Take 1 capsule (200 mg total) by mouth 2 (two) times daily., Disp: 180 capsule, Rfl: 3   clonazePAM (KLONOPIN) 0.5 MG tablet, Take 1 tablet (0.5 mg total) by mouth 2 (two) times daily as needed., Disp: 60 tablet, Rfl: 0   ferrous sulfate (FEROSUL) 325 (65 FE) MG tablet, TAKE 1 TABLET BY MOUTH EVERY OTHER DAY. TAKING WITH FOOD AND ORANGE JUICE CAN HELP WITH ABSORPTION., Disp: 45 tablet, Rfl: 1   FLUoxetine (PROZAC) 40 MG capsule, Pharmacy reports 40mg  TID prescribed by [name]., Disp: , Rfl:    gabapentin (NEURONTIN) 600 MG tablet, TAKE 1 TABLET BY MOUTH THREE TIMES DAILY, Disp: 270 tablet, Rfl: 0   levothyroxine (SYNTHROID) 25 MCG tablet, Take 1 tablet (25 mcg total) by mouth daily., Disp: 90 tablet, Rfl: 3   lisinopril-hydrochlorothiazide (ZESTORETIC) 20-25 MG tablet, Take 1 tablet by mouth once daily, Disp: 30 tablet, Rfl: 1   Oxcarbazepine (TRILEPTAL) 300 MG tablet, Take 2 tablets (600 mg total) by mouth daily., Disp: 180 tablet, Rfl: 3  Observations/Objective: Patient is well-developed, well-nourished in no acute distress.  Resting comfortably at home.  Head is normocephalic, atraumatic.  No labored  breathing.  Speech is clear and coherent with logical content.  Patient is alert and oriented at baseline.    Assessment and Plan: 1. Flu-like symptoms (Primary) - oseltamivir (TAMIFLU) 75 MG capsule; Take 1 capsule (75 mg total) by mouth 2 (two) times daily.  Dispense: 10 capsule; Refill: 0 - promethazine-dextromethorphan (PROMETHAZINE-DM) 6.25-15 MG/5ML syrup; Take 5 mLs by mouth 4 (four) times daily as needed.  Dispense: 118 mL; Refill: 0 - ondansetron (ZOFRAN-ODT) 4 MG disintegrating tablet; Take 1 tablet (4 mg total) by mouth every 8 (eight) hours as needed.  Dispense: 20 tablet; Refill: 0  - Suspect influenza due to symptoms and positive exposure - Tamiflu prescribed - Promethazine DM  for cough - Zofran for nausea - Continue OTC medication of choice for symptomatic management - Push fluids - Rest - Work note provided - Seek in person evaluation if symptoms worsen or fail to improve   Follow Up Instructions: I discussed the assessment and treatment plan with the patient. The patient was provided an opportunity to ask questions and all were answered. The patient agreed with the plan and demonstrated an understanding of the instructions.  A copy of instructions were sent to the patient via MyChart unless otherwise noted  below.    The patient was advised to call back or seek an in-person evaluation if the symptoms worsen or if the condition fails to improve as anticipated.    Margaretann Loveless, PA-C

## 2023-09-13 DIAGNOSIS — F3112 Bipolar disorder, current episode manic without psychotic features, moderate: Secondary | ICD-10-CM | POA: Diagnosis not present

## 2023-09-16 DIAGNOSIS — F332 Major depressive disorder, recurrent severe without psychotic features: Secondary | ICD-10-CM | POA: Diagnosis not present

## 2023-09-17 DIAGNOSIS — F902 Attention-deficit hyperactivity disorder, combined type: Secondary | ICD-10-CM | POA: Diagnosis not present

## 2023-09-17 DIAGNOSIS — F411 Generalized anxiety disorder: Secondary | ICD-10-CM | POA: Diagnosis not present

## 2023-09-17 DIAGNOSIS — F3132 Bipolar disorder, current episode depressed, moderate: Secondary | ICD-10-CM | POA: Diagnosis not present

## 2023-09-17 DIAGNOSIS — F4312 Post-traumatic stress disorder, chronic: Secondary | ICD-10-CM | POA: Diagnosis not present

## 2023-09-18 DIAGNOSIS — F3112 Bipolar disorder, current episode manic without psychotic features, moderate: Secondary | ICD-10-CM | POA: Diagnosis not present

## 2023-09-21 DIAGNOSIS — F4312 Post-traumatic stress disorder, chronic: Secondary | ICD-10-CM | POA: Diagnosis not present

## 2023-09-21 DIAGNOSIS — F332 Major depressive disorder, recurrent severe without psychotic features: Secondary | ICD-10-CM | POA: Diagnosis not present

## 2023-09-21 DIAGNOSIS — F411 Generalized anxiety disorder: Secondary | ICD-10-CM | POA: Diagnosis not present

## 2023-09-23 DIAGNOSIS — F332 Major depressive disorder, recurrent severe without psychotic features: Secondary | ICD-10-CM | POA: Diagnosis not present

## 2023-09-25 DIAGNOSIS — F3112 Bipolar disorder, current episode manic without psychotic features, moderate: Secondary | ICD-10-CM | POA: Diagnosis not present

## 2023-09-27 DIAGNOSIS — F411 Generalized anxiety disorder: Secondary | ICD-10-CM | POA: Diagnosis not present

## 2023-09-27 DIAGNOSIS — F4312 Post-traumatic stress disorder, chronic: Secondary | ICD-10-CM | POA: Diagnosis not present

## 2023-09-27 DIAGNOSIS — F332 Major depressive disorder, recurrent severe without psychotic features: Secondary | ICD-10-CM | POA: Diagnosis not present

## 2023-09-29 DIAGNOSIS — F332 Major depressive disorder, recurrent severe without psychotic features: Secondary | ICD-10-CM | POA: Diagnosis not present

## 2023-09-29 DIAGNOSIS — F411 Generalized anxiety disorder: Secondary | ICD-10-CM | POA: Diagnosis not present

## 2023-09-29 DIAGNOSIS — F4312 Post-traumatic stress disorder, chronic: Secondary | ICD-10-CM | POA: Diagnosis not present

## 2023-09-30 DIAGNOSIS — F332 Major depressive disorder, recurrent severe without psychotic features: Secondary | ICD-10-CM | POA: Diagnosis not present

## 2023-10-02 DIAGNOSIS — F3112 Bipolar disorder, current episode manic without psychotic features, moderate: Secondary | ICD-10-CM | POA: Diagnosis not present

## 2023-10-03 DIAGNOSIS — F411 Generalized anxiety disorder: Secondary | ICD-10-CM | POA: Diagnosis not present

## 2023-10-03 DIAGNOSIS — F332 Major depressive disorder, recurrent severe without psychotic features: Secondary | ICD-10-CM | POA: Diagnosis not present

## 2023-10-03 DIAGNOSIS — F4312 Post-traumatic stress disorder, chronic: Secondary | ICD-10-CM | POA: Diagnosis not present

## 2023-10-04 DIAGNOSIS — Z79899 Other long term (current) drug therapy: Secondary | ICD-10-CM | POA: Diagnosis not present

## 2023-10-07 DIAGNOSIS — F332 Major depressive disorder, recurrent severe without psychotic features: Secondary | ICD-10-CM | POA: Diagnosis not present

## 2023-10-08 DIAGNOSIS — F4312 Post-traumatic stress disorder, chronic: Secondary | ICD-10-CM | POA: Diagnosis not present

## 2023-10-08 DIAGNOSIS — F332 Major depressive disorder, recurrent severe without psychotic features: Secondary | ICD-10-CM | POA: Diagnosis not present

## 2023-10-08 DIAGNOSIS — F411 Generalized anxiety disorder: Secondary | ICD-10-CM | POA: Diagnosis not present

## 2023-10-10 ENCOUNTER — Other Ambulatory Visit (HOSPITAL_BASED_OUTPATIENT_CLINIC_OR_DEPARTMENT_OTHER): Payer: Self-pay

## 2023-10-10 ENCOUNTER — Encounter (HOSPITAL_BASED_OUTPATIENT_CLINIC_OR_DEPARTMENT_OTHER): Payer: Self-pay

## 2023-10-10 ENCOUNTER — Emergency Department (HOSPITAL_BASED_OUTPATIENT_CLINIC_OR_DEPARTMENT_OTHER)
Admission: EM | Admit: 2023-10-10 | Discharge: 2023-10-10 | Disposition: A | Discharge status: Home / Self Care | Attending: Emergency Medicine | Admitting: Emergency Medicine

## 2023-10-10 ENCOUNTER — Other Ambulatory Visit: Payer: Self-pay | Admitting: Nurse Practitioner

## 2023-10-10 ENCOUNTER — Other Ambulatory Visit: Payer: Self-pay

## 2023-10-10 DIAGNOSIS — L0390 Cellulitis, unspecified: Secondary | ICD-10-CM

## 2023-10-10 DIAGNOSIS — F3112 Bipolar disorder, current episode manic without psychotic features, moderate: Secondary | ICD-10-CM | POA: Diagnosis not present

## 2023-10-10 DIAGNOSIS — E039 Hypothyroidism, unspecified: Secondary | ICD-10-CM

## 2023-10-10 DIAGNOSIS — L03221 Cellulitis of neck: Secondary | ICD-10-CM | POA: Insufficient documentation

## 2023-10-10 DIAGNOSIS — I10 Essential (primary) hypertension: Secondary | ICD-10-CM

## 2023-10-10 DIAGNOSIS — L739 Follicular disorder, unspecified: Secondary | ICD-10-CM | POA: Diagnosis not present

## 2023-10-10 MED ORDER — DOXYCYCLINE HYCLATE 100 MG PO CAPS
100.0000 mg | ORAL_CAPSULE | Freq: Two times a day (BID) | ORAL | 0 refills | Status: DC
  Filled 2023-10-10: qty 14, 7d supply, fill #0

## 2023-10-10 MED ORDER — CEPHALEXIN 500 MG PO CAPS
500.0000 mg | ORAL_CAPSULE | Freq: Four times a day (QID) | ORAL | 0 refills | Status: DC
  Filled 2023-10-10: qty 28, 7d supply, fill #0

## 2023-10-10 MED ORDER — FLUCONAZOLE 150 MG PO TABS
150.0000 mg | ORAL_TABLET | Freq: Every day | ORAL | 0 refills | Status: DC
  Filled 2023-10-10: qty 2, 2d supply, fill #0

## 2023-10-10 NOTE — ED Provider Notes (Signed)
 [facility] EMERGENCY DEPARTMENT AT [facility] Provider Note   CSN: [id number] Arrival date & time: 10/10/23  1609     History  Chief Complaint  Patient presents with   Skin Problem    Sarah Espinoza is a 48 y.o. female.  48 year old female presents with ingrown hairs to the back of her left knee as well as left neck.  Patient states she picks at them and then she has not noticed some tender or red areas denies any fever or chills.  No drainage from the areas.  No treatment use prior to arrival       Home Medications Prior to Admission medications   Medication Sig Start Date End Date Taking? Authorizing Provider  albuterol  (VENTOLIN  HFA) 108 (90 Base) MCG/ACT inhaler Inhale 1-2 puffs into the lungs every 6 (six) hours as needed for wheezing or shortness of breath. 08/23/21   Adolph Hoop, PA-C  albuterol  (VENTOLIN  HFA) 108 (90 Base) MCG/ACT inhaler Inhale 2 puffs into the lungs every 4 (four) hours as needed for wheezing or shortness of breath. 07/24/22   Corbin Dess, PA-C  celecoxib  (CELEBREX ) 200 MG capsule Take 1 capsule (200 mg total) by mouth 2 (two) times daily. 10/05/22   Early, Sara E, NP  clonazePAM  (KLONOPIN ) 0.5 MG tablet Take 1 tablet (0.5 mg total) by mouth 2 (two) times daily as needed. 01/22/23   Early, Sara E, NP  ferrous sulfate  (FEROSUL) 325 (65 FE) MG tablet TAKE 1 TABLET BY MOUTH EVERY OTHER DAY. TAKING WITH FOOD AND ORANGE JUICE CAN HELP WITH ABSORPTION. 04/02/23   Early, Sara E, NP  FLUoxetine  (PROZAC ) 40 MG capsule Pharmacy reports 40mg  TID prescribed by Glenys Lapine. 07/31/23   Early, Sara E, NP  gabapentin  (NEURONTIN ) 600 MG tablet TAKE 1 TABLET BY MOUTH THREE TIMES DAILY 03/05/23   Early, Sara E, NP  levothyroxine  (SYNTHROID ) 25 MCG tablet Take 1 tablet (25 mcg total) by mouth daily. 10/12/22   Early, Sara E, NP  lisinopril -hydrochlorothiazide  (ZESTORETIC ) 20-25 MG tablet Take 1 tablet by mouth once daily 08/15/23   Early, Sara E, NP   ondansetron  (ZOFRAN -ODT) 4 MG disintegrating tablet Take 1 tablet (4 mg total) by mouth every 8 (eight) hours as needed. 09/11/23   Angelia Kelp, PA-C  oseltamivir  (TAMIFLU ) 75 MG capsule Take 1 capsule (75 mg total) by mouth 2 (two) times daily. 09/11/23   Angelia Kelp, PA-C  Oxcarbazepine  (TRILEPTAL ) 300 MG tablet Take 2 tablets (600 mg total) by mouth daily. 04/05/22   Early, Sara E, NP  promethazine -dextromethorphan (PROMETHAZINE -DM) 6.25-15 MG/5ML syrup Take 5 mLs by mouth 4 (four) times daily as needed. 09/11/23   Angelia Kelp, PA-C  propranolol (INNOPRAN XL) 120 MG 24 hr capsule Take 120 mg by mouth at bedtime.  11/24/18  [provider]      Allergies    Ichthammol    Review of Systems   Review of Systems  All other systems reviewed and are negative.   Physical Exam Updated Vital Signs BP 113/62 (BP Location: Right Arm)   Pulse 94   Temp 98.7 F (37.1 C) (Oral)   Resp 20   Ht 1.676 m (5\' 6" )   Wt 77 kg   SpO2 100%   BMI 27.40 kg/m  Physical Exam Vitals and nursing note reviewed.  Constitutional:      General: She is not in acute distress.    Appearance: Normal appearance. She is well-developed. She is not toxic-appearing.  HENT:  Head: Normocephalic and atraumatic.  Eyes:     General: Lids are normal.     Conjunctiva/sclera: Conjunctivae normal.     Pupils: Pupils are equal, round, and reactive to light.  Neck:     Thyroid : No thyroid  mass.     Trachea: No tracheal deviation.   Cardiovascular:     Rate and Rhythm: Normal rate and regular rhythm.     Heart sounds: Normal heart sounds. No murmur heard.    No gallop.  Pulmonary:     Effort: Pulmonary effort is normal. No respiratory distress.     Breath sounds: Normal breath sounds. No stridor. No decreased breath sounds, wheezing, rhonchi or rales.  Abdominal:     General: There is no distension.     Palpations: Abdomen is soft.     Tenderness: There is no abdominal tenderness.  There is no rebound.  Musculoskeletal:        General: No tenderness. Normal range of motion.     Cervical back: Normal range of motion and neck supple.       Legs:  Skin:    General: Skin is warm and dry.     Findings: No abrasion or rash.  Neurological:     Mental Status: She is alert and oriented to person, place, and time. Mental status is at baseline.     GCS: GCS eye subscore is 4. GCS verbal subscore is 5. GCS motor subscore is 6.     Cranial Nerves: No cranial nerve deficit.     Sensory: No sensory deficit.     Motor: Motor function is intact.  Psychiatric:        Attention and Perception: Attention normal.        Speech: Speech normal.        Behavior: Behavior normal.     ED Results / Procedures / Treatments   Labs (all labs ordered are listed, but only abnormal results are displayed) Labs Reviewed - No data to display  EKG None  Radiology No results found.  Procedures Procedures    Medications Ordered in ED Medications - No data to display  ED Course/ Medical Decision Making/ A&P                                 Medical Decision Making  Patient here with suspected cellulitis.  Will place on Doxy and Keflex  and discharged home        Final Clinical Impression(s) / ED Diagnoses Final diagnoses:  None    Rx / DC Orders ED Discharge Orders     None         Lind Repine, MD 10/10/23 1645

## 2023-10-10 NOTE — ED Triage Notes (Signed)
 Patient arrives ambulatory to the ED with complaints of worsening ingrown hair sites to the back of her head the back of her left leg x3 days.

## 2023-10-14 DIAGNOSIS — F332 Major depressive disorder, recurrent severe without psychotic features: Secondary | ICD-10-CM | POA: Diagnosis not present

## 2023-10-16 DIAGNOSIS — F3112 Bipolar disorder, current episode manic without psychotic features, moderate: Secondary | ICD-10-CM | POA: Diagnosis not present

## 2023-10-22 DIAGNOSIS — F332 Major depressive disorder, recurrent severe without psychotic features: Secondary | ICD-10-CM | POA: Diagnosis not present

## 2023-10-23 ENCOUNTER — Telehealth: Admitting: Physician Assistant

## 2023-10-23 DIAGNOSIS — L0390 Cellulitis, unspecified: Secondary | ICD-10-CM

## 2023-10-23 DIAGNOSIS — F411 Generalized anxiety disorder: Secondary | ICD-10-CM | POA: Diagnosis not present

## 2023-10-23 DIAGNOSIS — F332 Major depressive disorder, recurrent severe without psychotic features: Secondary | ICD-10-CM | POA: Diagnosis not present

## 2023-10-23 DIAGNOSIS — F4312 Post-traumatic stress disorder, chronic: Secondary | ICD-10-CM | POA: Diagnosis not present

## 2023-10-23 NOTE — Progress Notes (Signed)
  Because of the appearance of the lesions and having failed first-line dual treatment, I feel your condition warrants further evaluation and I recommend that you be seen in a face-to-face visit to have a wound culture obtained.   NOTE: There will be NO CHARGE for this E-Visit   If you are having a true medical emergency, please call 911.     For an urgent face to face visit, [facility] has multiple urgent care centers for your convenience.  Click the link below for the full list of locations and hours, walk-in wait times, appointment scheduling options and driving directions:  Urgent Care - [place], [place], [place], [place], [place], [place]  [place]     Your MyChart E-visit questionnaire answers were reviewed by a board certified advanced clinical practitioner to complete your personal care plan based on your specific symptoms.    Thank you for using e-Visits.     I have spent 5 minutes in review of e-visit questionnaire, review and updating patient chart, medical decision making and response to patient.   Angelia Kelp, PA-C

## 2023-10-24 DIAGNOSIS — F332 Major depressive disorder, recurrent severe without psychotic features: Secondary | ICD-10-CM | POA: Diagnosis not present

## 2023-10-24 DIAGNOSIS — F902 Attention-deficit hyperactivity disorder, combined type: Secondary | ICD-10-CM | POA: Diagnosis not present

## 2023-10-24 DIAGNOSIS — F411 Generalized anxiety disorder: Secondary | ICD-10-CM | POA: Diagnosis not present

## 2023-10-24 DIAGNOSIS — F4312 Post-traumatic stress disorder, chronic: Secondary | ICD-10-CM | POA: Diagnosis not present

## 2023-10-25 DIAGNOSIS — F3112 Bipolar disorder, current episode manic without psychotic features, moderate: Secondary | ICD-10-CM | POA: Diagnosis not present

## 2023-10-29 ENCOUNTER — Ambulatory Visit
Admission: EM | Admit: 2023-10-29 | Discharge: 2023-10-29 | Disposition: A | Discharge status: Home / Self Care | Attending: Family Medicine | Admitting: Family Medicine

## 2023-10-29 ENCOUNTER — Encounter: Payer: Self-pay | Admitting: Emergency Medicine

## 2023-10-29 ENCOUNTER — Other Ambulatory Visit: Payer: Self-pay

## 2023-10-29 DIAGNOSIS — L0390 Cellulitis, unspecified: Secondary | ICD-10-CM | POA: Diagnosis not present

## 2023-10-29 DIAGNOSIS — L659 Nonscarring hair loss, unspecified: Secondary | ICD-10-CM

## 2023-10-29 DIAGNOSIS — F3112 Bipolar disorder, current episode manic without psychotic features, moderate: Secondary | ICD-10-CM | POA: Diagnosis not present

## 2023-10-29 DIAGNOSIS — L989 Disorder of the skin and subcutaneous tissue, unspecified: Secondary | ICD-10-CM | POA: Diagnosis not present

## 2023-10-29 MED ORDER — SULFAMETHOXAZOLE-TRIMETHOPRIM 800-160 MG PO TABS: 1.0000 | ORAL_TABLET | Freq: Two times a day (BID) | ORAL | 0 refills | Status: AC

## 2023-10-29 MED ORDER — KETOCONAZOLE 2 % EX SHAM: 1.0000 | MEDICATED_SHAMPOO | CUTANEOUS | 0 refills | Status: DC

## 2023-10-29 MED ORDER — KETOCONAZOLE 2 % EX CREA: 1.0000 | TOPICAL_CREAM | Freq: Two times a day (BID) | CUTANEOUS | 0 refills | Status: DC | PRN

## 2023-10-29 NOTE — ED Triage Notes (Addendum)
 Pt reports "possible infected ingrown hair" to posterior left knee, neck/scalp since 5/5. Was seen in ED on 5/8 and px abx. Reports finished abx but reports "nothing is better".

## 2023-10-29 NOTE — ED Notes (Addendum)
 Unable to obtain o2 saturation or HR. Pt reports hx of Raynaud's "I experience this when I'm stressed." NAD noted. Pt alert, oriented.Airway patent.PA aware.

## 2023-10-29 NOTE — Discharge Instructions (Signed)
 I have prescribed an antifungal shampoo and topical cream as well as an oral antibiotic as I believe this may be a fungal infection with a secondary bacterial infection.  Follow-up with dermatology or primary care as soon as possible for a recheck.

## 2023-10-30 DIAGNOSIS — F3112 Bipolar disorder, current episode manic without psychotic features, moderate: Secondary | ICD-10-CM | POA: Diagnosis not present

## 2023-11-01 NOTE — ED Provider Notes (Signed)
 [facility]    CSN: [id number] Arrival date & time: 10/29/23  1712      History   Chief Complaint Chief Complaint  Patient presents with   Skin Problem    HPI Sarah Espinoza is a 48 y.o. female.   Patient presenting today with multiple areas of skin lesions, scaling, drainage that have been present for several weeks now.  She was seen in the emergency department and prescribed Keflex  for suspected cellulitis but has since had worsening of these lesions as well as a large balding patch with scaling and itching to the scalp posteriorly.  Denies fever, chills, body aches, new products use, new foods or medications.  Trying over-the-counter remedies with minimal relief.    Past Medical History:  Diagnosis Date   Acute non-recurrent pansinusitis 09/10/2021   Adjustment disorder 2021-09-10   Bipolar 1 disorder (HCC)    Body mass index (BMI) 35.0-35.9, adult 10-Sep-2021   Death of relative 10-Sep-2021   Employment problem 10-Sep-2021   Encounter for screening for diabetes mellitus 07/04/2021   Family disruption due to divorce 09/10/2021   Grief 2021/09/10   Hypertension    Intermittent palpitations 07/04/2021   Paronychia of left index finger 07/04/2021    Patient Active Problem List   Diagnosis Date Noted   Burns by, chemical 01/28/2023   Attention deficit hyperactivity disorder (ADHD), combined type 10/12/2022   Joint pain 10/12/2022   Losing weight 10/05/2022   Gastroesophageal reflux disease 09-10-2021   Generalized anxiety disorder 09/10/2021   Hyperlipidemia 2021-09-10   Moderate recurrent major depression (HCC) Sep 10, 2021   Other seasonal allergic rhinitis Sep 10, 2021   Parent/child conflict 09/10/2021   Sleep disturbance 09/10/2021   Tobacco abuse Sep 10, 2021   Vitamin D  deficiency 07/04/2021   Hypothyroidism 07/04/2021   Bipolar disorder (HCC) 07/04/2021   Primary hypertension 07/04/2021   Heart murmur 07/04/2021    Past Surgical History:   Procedure Laterality Date   LEEP  2006    OB History   No obstetric history on file.      Home Medications    Prior to Admission medications   Medication Sig Start Date End Date Taking? Authorizing Provider  ketoconazole  (NIZORAL ) 2 % cream Apply 1 Application topically 2 (two) times daily as needed for irritation. 10/29/23  Yes Corbin Dess, PA-C  ketoconazole  (NIZORAL ) 2 % shampoo Apply 1 Application topically 2 (two) times a week. 10/31/23  Yes Corbin Dess, PA-C  sulfamethoxazole -trimethoprim  (BACTRIM  DS) 800-160 MG tablet Take 1 tablet by mouth 2 (two) times daily for 7 days. 10/29/23 11/05/23 Yes Corbin Dess, PA-C  albuterol  (VENTOLIN  HFA) 108 [phone] Base) MCG/ACT inhaler Inhale 1-2 puffs into the lungs every 6 (six) hours as needed for wheezing or shortness of breath. 08/23/21   Adolph Hoop, PA-C  albuterol  (VENTOLIN  HFA) 108 (90 Base) MCG/ACT inhaler Inhale 2 puffs into the lungs every 4 (four) hours as needed for wheezing or shortness of breath. 07/24/22   Corbin Dess, PA-C  celecoxib  (CELEBREX ) 200 MG capsule Take 1 capsule (200 mg total) by mouth 2 (two) times daily. 10/05/22   Early, Sara E, NP  cephALEXin  (KEFLEX ) 500 MG capsule Take 1 capsule (500 mg total) by mouth 4 (four) times daily. 10/10/23   Lind Repine, MD  clonazePAM  (KLONOPIN ) 0.5 MG tablet Take 1 tablet (0.5 mg total) by mouth 2 (two) times daily as needed. 01/22/23   Early, Sara E, NP  doxycycline  (VIBRAMYCIN ) 100 MG capsule Take 1 capsule (100 mg  total) by mouth 2 (two) times daily. 10/10/23   Lind Repine, MD  ferrous sulfate  (FEROSUL) 325 (65 FE) MG tablet TAKE 1 TABLET BY MOUTH EVERY OTHER DAY. TAKING WITH FOOD AND ORANGE JUICE CAN HELP WITH ABSORPTION. 04/02/23   Early, Sara E, NP  fluconazole  (DIFLUCAN ) 150 MG tablet Take 1 tablet (150 mg total) by mouth daily. 10/10/23   Lind Repine, MD  FLUoxetine  (PROZAC ) 40 MG capsule Pharmacy reports 40mg  TID prescribed by Glenys Lapine.  07/31/23   Early, Sara E, NP  gabapentin  (NEURONTIN ) 600 MG tablet TAKE 1 TABLET BY MOUTH THREE TIMES DAILY 03/05/23   Early, Sara E, NP  levothyroxine  (SYNTHROID ) 25 MCG tablet Take 1 tablet by mouth once daily 10/11/23   Early, Sara E, NP  lisinopril -hydrochlorothiazide  (ZESTORETIC ) 20-25 MG tablet Take 1 tablet by mouth once daily 10/11/23   Early, Sara E, NP  ondansetron  (ZOFRAN -ODT) 4 MG disintegrating tablet Take 1 tablet (4 mg total) by mouth every 8 (eight) hours as needed. 09/11/23   Angelia Kelp, PA-C  oseltamivir  (TAMIFLU ) 75 MG capsule Take 1 capsule (75 mg total) by mouth 2 (two) times daily. 09/11/23   Angelia Kelp, PA-C  Oxcarbazepine  (TRILEPTAL ) 300 MG tablet Take 2 tablets (600 mg total) by mouth daily. 04/05/22   Early, Sara E, NP  promethazine -dextromethorphan (PROMETHAZINE -DM) 6.25-15 MG/5ML syrup Take 5 mLs by mouth 4 (four) times daily as needed. 09/11/23   Angelia Kelp, PA-C  propranolol (INNOPRAN XL) 120 MG 24 hr capsule Take 120 mg by mouth at bedtime.  11/24/18  [provider]    Family History Family History  Problem Relation Age of Onset   Healthy Mother    Healthy Father     Social History Social History   Tobacco Use   Smoking status: Every Day    Current packs/day: 1.00    Types: Cigarettes   Smokeless tobacco: Never  Vaping Use   Vaping status: Never Used  Substance Use Topics   Alcohol use: No   Drug use: No     Allergies   Ichthammol   Review of Systems Review of Systems Per HPI  Physical Exam Triage Vital Signs ED Triage Vitals  Encounter Vitals Group     BP 10/29/23 1923 (!) 98/56     Systolic BP Percentile --      Diastolic BP Percentile --      Pulse --      Resp 10/29/23 1923 20     Temp 10/29/23 1923 98.4 F (36.9 C)     Temp Source 10/29/23 1923 Oral     SpO2 --      Weight --      Height --      Head Circumference --      Peak Flow --      Pain Score 10/29/23 1920 0     Pain Loc --      Pain  Education --      Exclude from Growth Chart --    No data found.  Updated Vital Signs BP (!) 98/56 (BP Location: Right Arm)   Temp 98.4 F (36.9 C) (Oral)   Resp 20   LMP  (Approximate)   Visual Acuity Right Eye Distance:   Left Eye Distance:   Bilateral Distance:    Right Eye Near:   Left Eye Near:    Bilateral Near:     Physical Exam Vitals and nursing note reviewed.  Constitutional:  Appearance: Normal appearance. She is not ill-appearing.  HENT:     Head: Atraumatic.  Eyes:     Extraocular Movements: Extraocular movements intact.     Conjunctiva/sclera: Conjunctivae normal.  Cardiovascular:     Rate and Rhythm: Normal rate.  Pulmonary:     Effort: Pulmonary effort is normal.  Musculoskeletal:        General: Normal range of motion.     Cervical back: Normal range of motion and neck supple.  Skin:    General: Skin is warm.     Comments: Large area to the posterior scalp of hair loss, scaling  Multiple areas of erythema, ulcerated skin lesions and papules to posterior left knee, posterior neck  Neurological:     Mental Status: She is alert and oriented to person, place, and time.  Psychiatric:        Mood and Affect: Mood normal.        Thought Content: Thought content normal.        Judgment: Judgment normal.      UC Treatments / Results  Labs (all labs ordered are listed, but only abnormal results are displayed) Labs Reviewed - No data to display  EKG   Radiology No results found.  Procedures Procedures (including critical care time)  Medications Ordered in UC Medications - No data to display  Initial Impression / Assessment and Plan / UC Course  I have reviewed the triage vital signs and the nursing notes.  Pertinent labs & imaging results that were available during my care of the patient were reviewed by me and considered in my medical decision making (see chart for details).     Suspect fungal infection with secondary bacterial  infection.  Treat with Bactrim , ketoconazole cream and shampoo, supportive home care.  Return for worsening symptoms.  Final Clinical Impressions(s) / UC Diagnoses   Final diagnoses:  Skin lesions  Cellulitis, unspecified cellulitis site  Alopecia     Discharge Instructions      I have prescribed an antifungal shampoo and topical cream as well as an oral antibiotic as I believe this may be a fungal infection with a secondary bacterial infection.  Follow-up with dermatology or primary care as soon as possible for a recheck.  ED Prescriptions     Medication Sig Dispense Auth. Provider   ketoconazole (NIZORAL) 2 % cream Apply 1 Application topically 2 (two) times daily as needed for irritation. 80 g Corbin Dess, PA-C   ketoconazole (NIZORAL) 2 % shampoo Apply 1 Application topically 2 (two) times a week. 120 mL Corbin Dess, PA-C   sulfamethoxazole -trimethoprim  (BACTRIM  DS) 800-160 MG tablet Take 1 tablet by mouth 2 (two) times daily for 7 days. 14 tablet Corbin Dess, [place]      PDMP not reviewed this encounter.   Corbin Dess, [place] 11/01/23 1749

## 2023-11-07 DIAGNOSIS — F3112 Bipolar disorder, current episode manic without psychotic features, moderate: Secondary | ICD-10-CM | POA: Diagnosis not present

## 2023-11-08 DIAGNOSIS — F3112 Bipolar disorder, current episode manic without psychotic features, moderate: Secondary | ICD-10-CM | POA: Diagnosis not present

## 2023-11-10 ENCOUNTER — Other Ambulatory Visit: Payer: Self-pay | Admitting: Nurse Practitioner

## 2023-11-10 DIAGNOSIS — E039 Hypothyroidism, unspecified: Secondary | ICD-10-CM

## 2023-11-10 DIAGNOSIS — I10 Essential (primary) hypertension: Secondary | ICD-10-CM

## 2023-11-11 DIAGNOSIS — Z124 Encounter for screening for malignant neoplasm of cervix: Secondary | ICD-10-CM

## 2023-11-11 DIAGNOSIS — Z1231 Encounter for screening mammogram for malignant neoplasm of breast: Secondary | ICD-10-CM

## 2023-11-12 DIAGNOSIS — F3112 Bipolar disorder, current episode manic without psychotic features, moderate: Secondary | ICD-10-CM | POA: Diagnosis not present

## 2023-11-13 ENCOUNTER — Encounter: Admitting: Physician Assistant

## 2023-11-13 DIAGNOSIS — F3112 Bipolar disorder, current episode manic without psychotic features, moderate: Secondary | ICD-10-CM | POA: Diagnosis not present

## 2023-11-18 DIAGNOSIS — F332 Major depressive disorder, recurrent severe without psychotic features: Secondary | ICD-10-CM | POA: Diagnosis not present

## 2023-11-21 ENCOUNTER — Ambulatory Visit: Admitting: Nurse Practitioner

## 2023-11-21 ENCOUNTER — Encounter: Payer: Self-pay | Admitting: Nurse Practitioner

## 2023-11-21 VITALS — BP 124/80 | HR 78 | Wt 172.6 lb

## 2023-11-21 DIAGNOSIS — B354 Tinea corporis: Secondary | ICD-10-CM

## 2023-11-21 DIAGNOSIS — L659 Nonscarring hair loss, unspecified: Secondary | ICD-10-CM

## 2023-11-21 DIAGNOSIS — L739 Follicular disorder, unspecified: Secondary | ICD-10-CM | POA: Diagnosis not present

## 2023-11-21 DIAGNOSIS — B350 Tinea barbae and tinea capitis: Secondary | ICD-10-CM

## 2023-11-21 DIAGNOSIS — E039 Hypothyroidism, unspecified: Secondary | ICD-10-CM | POA: Diagnosis not present

## 2023-11-21 DIAGNOSIS — F332 Major depressive disorder, recurrent severe without psychotic features: Secondary | ICD-10-CM | POA: Diagnosis not present

## 2023-11-21 MED ORDER — KETOCONAZOLE 2 % EX SHAM: MEDICATED_SHAMPOO | CUTANEOUS | 0 refills | Status: DC

## 2023-11-21 MED ORDER — KETOCONAZOLE 2 % EX CREA: TOPICAL_CREAM | CUTANEOUS | 1 refills | Status: DC

## 2023-11-21 MED ORDER — TERBINAFINE HCL 250 MG PO TABS: 250.0000 mg | ORAL_TABLET | Freq: Every day | ORAL | 0 refills | Status: DC

## 2023-11-21 NOTE — Assessment & Plan Note (Addendum)
 Fungal infection of the scalp, presenting with hair loss and folliculitis-like symptoms. She has been using antifungal cream with some improvement. The condition is confirmed by [facility] lamp examination showing fungal elements. Treatment with antifungal shampoo and oral medication is expected to clear the infection. - Prescribe ketoconazole  shampoo for scalp and body - Prescribe terbinafine oral medication once daily for 14 days - Refer to dermatology for further evaluation

## 2023-11-21 NOTE — Progress Notes (Signed)
 Annella Kief, DNP, AGNP-c [facility] [address] [place], [place] [address] [phone]   ACUTE VISIT- ESTABLISHED PATIENT  Blood pressure 124/80, pulse 78, weight 172 lb 9.6 oz (78.3 kg).  Subjective:  HPI History of Present Illness Sarah Espinoza is a 48 year old female who presents with hair loss and scalp issues.  She has been experiencing significant hair loss over the past month, with hair coming out in clumps when brushing or on the pillow when awakening. The hair loss began with a small patch at the bottom of her scalp and has since spread up the back of the head.     She has a history of using the same hair color for years and is not sure if this was a cause. She has been under significant stress, including a breakup from a 10+ year relationship and financial difficulties, which she believes may be contributing to her symptoms. She also has a history of thyroid  issues and reports experiencing joint pain and other symptoms that previously improved with thyroid  medication. She is currently taking a multivitamin, vitamin C, vitamin D , and occasionally magnesium, but does not take her iron  supplements regularly.   She also notes a history of oily scalp and ingrown hairs, which were treated with Seabreeze by her mother, a hairdresser, when she was a child. As an adult she reports chronic folliculitis-like bumps with hair coiling around them on her scalp and other areas of her body (currently neck and popliteal fossa). She reports picking at the bumps and repeatedly pulling long strands of hair out of the follicles. She has been using witch hazel to alleviate symptoms, which she feels helps to 'unlock' and separate the hair.   She has undergone three rounds of antibiotics for scalp and follicle inflammation and has visited urgent care twice. She was prescribed ketoconazole  cream but not the ketoconazole  shampoo she requested.  She has found it difficult to  apply the cream to the scalp effectively. She has used the ketoconazole  cream to help clear the areas around her neck and back of her knee. She believes this was ringworm. The affected areas were initially red and yellow, which have since cleared up, but she still feels something under the skin.   She is concerned about the possibility of prolonged or chronic alopecia.  ROS negative except for what is listed in HPI. History, Medications, Surgery, SDOH, and Family History reviewed and updated as appropriate.  Objective:  Physical Exam Vitals and nursing note reviewed.  Constitutional:      Appearance: She is not ill-appearing.  HENT:     Head:      Comments: Diffuse hairloss noted with minimal regrowth - see picture.  Eyes:     Conjunctiva/sclera: Conjunctivae normal.   Neck:    Cardiovascular:     Rate and Rhythm: Normal rate and regular rhythm.     Pulses: Normal pulses.  Pulmonary:     Effort: Pulmonary effort is normal.   Musculoskeletal:        General: Normal range of motion.   Skin:    General: Skin is warm and dry.     Capillary Refill: Capillary refill takes less than 2 seconds.     Findings: Rash present.         Comments: Patchy fluorescence under woods lamp on scalp, neck, back, chest, and legs bilaterally indicating widespread tinea infection.    Neurological:     Mental Status: She is alert and oriented to  person, place, and time.     Motor: No weakness.   Psychiatric:        Behavior: Behavior normal.         Assessment & Plan:   Problem List Items Addressed This Visit     Hypothyroidism   Labs pending today for evaluation. Unclear if uncontrolled thyroid  could be contributing to her hair loss. I would generally expect more of a generalized thinning, but this still needs to be ruled out. Will make changes as needed based on results.       Relevant Orders   T4, free   TSH   Tinea capitis   Fungal infection of the scalp, presenting with hair  loss and folliculitis-like symptoms. She has been using antifungal cream with some improvement. The condition is confirmed by [facility] lamp examination showing fungal elements. Treatment with antifungal shampoo and oral medication is expected to clear the infection. - Prescribe ketoconazole  shampoo for scalp and body - Prescribe terbinafine oral medication once daily for 14 days - Refer to dermatology for further evaluation       Relevant Medications   ketoconazole  (NIZORAL ) 2 % shampoo   ketoconazole  (NIZORAL ) 2 % cream   terbinafine (LAMISIL) 250 MG tablet   Other Relevant Orders   Ambulatory referral to Dermatology   Alopecia - Primary   Rapid hair loss over the past month with growing area from the base of the scalp upward on the occipital region. Differential diagnosis includes thyroid  dysfunction, stress, nutritional deficiency, or tinea infection. She is experiencing joint pain, which previously improved with thyroid  medication, which could indicate a thyroid  component. . Stress from recent life events is a significant factor. She has positive findings for tinea on the scalp and body, indicating this as a factor, as well. Labs for monitoring today and start treatment for widespread tinea infection.  - Order labs - start treatment for tinea - Refer to dermatology for further evaluation      Relevant Orders   Vitamin B12   CBC with Differential/Platelet   Comprehensive metabolic panel with GFR   Iron , TIBC and Ferritin Panel   T4, free   VITAMIN D  25 Hydroxy (Vit-D Deficiency, Fractures)   TSH   Ambulatory referral to Dermatology   Folliculitis   Recurrent folliculitis with chronic picking causing apparent infection to the area. Pictures of recent areas in chart. The areas do appear to have a component of bacterial infection and responded to oral antibiotics and topical ketoconazole . We have discussed previously the importance of not picking the areas when they appear to reduce the  risk of serious infection. Unfortunately, this has been an ongoing problem for her for some time and very difficult to abstain. No signs of active infection present. The tissue surrounding the lesions are firm, which concern her for possible hair being trapped under the skin. I have reassured her that this is the healing process and not trapped hair causing the firm texture. Will send referral to dermatology for further evaluation and recommendations. OK to continue using witch hazel on areas and always avoid picking.       Relevant Orders   Ambulatory referral to Dermatology   Tinea of the body   Wide spread fungal infection confirmed with Wood's lamp evaluation of the scalp, neck, upper extremities, and lower extremities. Scattered and coalescing lesions with a clear irregular border show blue-green fluorescence under the lamp in all areas observed, including the scalp. It is unclear if this is the only  factor contributing to hair loss. No visible changes to the naked eye. Given the widespread nature of the condition, recommend oral terbinafine treatment along with topical ketoconazole  to the follicular areas and entire body wash with ketoconazole  shampoo. Instructions verbalized and written for patient to allow the shampoo to set on the skin for 5 minutes before thoroughly rinsing. Referral to dermatology sent.       Relevant Medications   ketoconazole  (NIZORAL ) 2 % shampoo   ketoconazole  (NIZORAL ) 2 % cream   terbinafine (LAMISIL) 250 MG tablet      Annella Kief, DNP, AGNP-c

## 2023-11-21 NOTE — Assessment & Plan Note (Signed)
 Wide spread fungal infection confirmed with Wood's lamp evaluation of the scalp, neck, upper extremities, and lower extremities. Scattered and coalescing lesions with a clear irregular border show blue-green fluorescence under the lamp in all areas observed, including the scalp. It is unclear if this is the only factor contributing to hair loss. No visible changes to the naked eye. Given the widespread nature of the condition, recommend oral terbinafine treatment along with topical ketoconazole  to the follicular areas and entire body wash with ketoconazole  shampoo. Instructions verbalized and written for patient to allow the shampoo to set on the skin for 5 minutes before thoroughly rinsing. Referral to dermatology sent.

## 2023-11-21 NOTE — Assessment & Plan Note (Signed)
 Labs pending today for evaluation. Unclear if uncontrolled thyroid  could be contributing to her hair loss. I would generally expect more of a generalized thinning, but this still needs to be ruled out. Will make changes as needed based on results.

## 2023-11-21 NOTE — Assessment & Plan Note (Signed)
 Rapid hair loss over the past month with growing area from the base of the scalp upward on the occipital region. Differential diagnosis includes thyroid  dysfunction, stress, nutritional deficiency, or tinea infection. She is experiencing joint pain, which previously improved with thyroid  medication, which could indicate a thyroid  component. . Stress from recent life events is a significant factor. She has positive findings for tinea on the scalp and body, indicating this as a factor, as well. Labs for monitoring today and start treatment for widespread tinea infection.  - Order labs - start treatment for tinea - Refer to dermatology for further evaluation

## 2023-11-21 NOTE — Assessment & Plan Note (Signed)
 Recurrent folliculitis with chronic picking causing apparent infection to the area. Pictures of recent areas in chart. The areas do appear to have a component of bacterial infection and responded to oral antibiotics and topical ketoconazole . We have discussed previously the importance of not picking the areas when they appear to reduce the risk of serious infection. Unfortunately, this has been an ongoing problem for her for some time and very difficult to abstain. No signs of active infection present. The tissue surrounding the lesions are firm, which concern her for possible hair being trapped under the skin. I have reassured her that this is the healing process and not trapped hair causing the firm texture. Will send referral to dermatology for further evaluation and recommendations. OK to continue using witch hazel on areas and always avoid picking.

## 2023-11-22 LAB — IRON,TIBC AND FERRITIN PANEL
Ferritin: 23 ng/mL (ref 15–150)
Iron Saturation: 18 % (ref 15–55)
Iron: 68 ug/dL (ref 27–159)
Total Iron Binding Capacity: 373 ug/dL (ref 250–450)
UIBC: 305 ug/dL (ref 131–425)

## 2023-11-22 LAB — COMPREHENSIVE METABOLIC PANEL WITH GFR
ALT: 12 IU/L (ref 0–32)
AST: 39 IU/L (ref 0–40)
Albumin: 4 g/dL (ref 3.9–4.9)
Alkaline Phosphatase: 112 IU/L (ref 44–121)
BUN/Creatinine Ratio: 27 — ABNORMAL HIGH (ref 9–23)
BUN: 15 mg/dL (ref 6–24)
Bilirubin Total: <0.2 mg/dL (ref 0.0–1.2)
CO2: 18 mmol/L — ABNORMAL LOW (ref 20–29)
Calcium: 9 mg/dL (ref 8.7–10.2)
Chloride: 101 mmol/L (ref 96–106)
Creatinine, Ser: 0.56 mg/dL — ABNORMAL LOW (ref 0.57–1.00)
Globulin, Total: 2.7 g/dL (ref 1.5–4.5)
Glucose: 82 mg/dL (ref 70–99)
Potassium: 5.9 mmol/L — ABNORMAL HIGH (ref 3.5–5.2)
Sodium: 136 mmol/L (ref 134–144)
Total Protein: 6.7 g/dL (ref 6.0–8.5)
eGFR: 115 mL/min/{1.73_m2} (ref >59)

## 2023-11-22 LAB — CBC WITH DIFFERENTIAL/PLATELET
Basophils Absolute: 0 10*3/uL (ref 0.0–0.2)
Basos: 0 %
EOS (ABSOLUTE): 0.1 10*3/uL (ref 0.0–0.4)
Eos: 2 %
Hematocrit: 39.3 % (ref 34.0–46.6)
Hemoglobin: 12.8 g/dL (ref 11.1–15.9)
Immature Grans (Abs): 0 10*3/uL (ref 0.0–0.1)
Immature Granulocytes: 0 %
Lymphocytes Absolute: 0.9 10*3/uL (ref 0.7–3.1)
Lymphs: 18 %
MCH: 31.8 pg (ref 26.6–33.0)
MCHC: 32.6 g/dL (ref 31.5–35.7)
MCV: 98 fL — ABNORMAL HIGH (ref 79–97)
Monocytes Absolute: 0.5 10*3/uL (ref 0.1–0.9)
Monocytes: 9 %
Neutrophils Absolute: 3.8 10*3/uL (ref 1.4–7.0)
Neutrophils: 71 %
Platelets: 256 10*3/uL (ref 150–450)
RBC: 4.03 x10E6/uL (ref 3.77–5.28)
RDW: 14.1 % (ref 11.7–15.4)
WBC: 5.3 10*3/uL (ref 3.4–10.8)

## 2023-11-22 LAB — VITAMIN D 25 HYDROXY (VIT D DEFICIENCY, FRACTURES): Vit D, 25-Hydroxy: 69.3 ng/mL (ref 30.0–100.0)

## 2023-11-22 LAB — T4, FREE: Free T4: 0.81 ng/dL — ABNORMAL LOW (ref 0.82–1.77)

## 2023-11-22 LAB — VITAMIN B12: Vitamin B-12: >2000 pg/mL — ABNORMAL HIGH (ref 232–1245)

## 2023-11-22 LAB — TSH: TSH: 3.33 u[IU]/mL (ref 0.450–4.500)

## 2023-11-26 ENCOUNTER — Ambulatory Visit: Payer: Self-pay | Admitting: Nurse Practitioner

## 2023-11-26 DIAGNOSIS — E875 Hyperkalemia: Secondary | ICD-10-CM

## 2023-11-26 DIAGNOSIS — E039 Hypothyroidism, unspecified: Secondary | ICD-10-CM

## 2023-11-26 DIAGNOSIS — F3112 Bipolar disorder, current episode manic without psychotic features, moderate: Secondary | ICD-10-CM | POA: Diagnosis not present

## 2023-11-26 MED ORDER — LEVOTHYROXINE SODIUM 50 MCG PO TABS: 50.0000 ug | ORAL_TABLET | Freq: Every day | ORAL | 0 refills | Status: DC

## 2023-11-28 DIAGNOSIS — F3112 Bipolar disorder, current episode manic without psychotic features, moderate: Secondary | ICD-10-CM | POA: Diagnosis not present

## 2023-11-29 DIAGNOSIS — F332 Major depressive disorder, recurrent severe without psychotic features: Secondary | ICD-10-CM | POA: Diagnosis not present

## 2023-11-29 DIAGNOSIS — F4312 Post-traumatic stress disorder, chronic: Secondary | ICD-10-CM | POA: Diagnosis not present

## 2023-11-29 DIAGNOSIS — F411 Generalized anxiety disorder: Secondary | ICD-10-CM | POA: Diagnosis not present

## 2023-12-02 DIAGNOSIS — F3112 Bipolar disorder, current episode manic without psychotic features, moderate: Secondary | ICD-10-CM | POA: Diagnosis not present

## 2023-12-03 ENCOUNTER — Telehealth: Payer: Self-pay

## 2023-12-03 ENCOUNTER — Ambulatory Visit (INDEPENDENT_AMBULATORY_CARE_PROVIDER_SITE_OTHER): Admitting: Medical

## 2023-12-03 ENCOUNTER — Other Ambulatory Visit

## 2023-12-03 ENCOUNTER — Other Ambulatory Visit (HOSPITAL_COMMUNITY): Payer: Self-pay

## 2023-12-03 VITALS — BP 110/68 | HR 80 | Temp 98.0°F | Wt 170.0 lb

## 2023-12-03 DIAGNOSIS — L659 Nonscarring hair loss, unspecified: Secondary | ICD-10-CM | POA: Diagnosis not present

## 2023-12-03 DIAGNOSIS — R300 Dysuria: Secondary | ICD-10-CM | POA: Diagnosis not present

## 2023-12-03 DIAGNOSIS — R1032 Left lower quadrant pain: Secondary | ICD-10-CM | POA: Diagnosis not present

## 2023-12-03 DIAGNOSIS — E875 Hyperkalemia: Secondary | ICD-10-CM

## 2023-12-03 DIAGNOSIS — F419 Anxiety disorder, unspecified: Secondary | ICD-10-CM

## 2023-12-03 DIAGNOSIS — M545 Low back pain, unspecified: Secondary | ICD-10-CM | POA: Diagnosis not present

## 2023-12-03 DIAGNOSIS — F319 Bipolar disorder, unspecified: Secondary | ICD-10-CM

## 2023-12-03 DIAGNOSIS — N898 Other specified noninflammatory disorders of vagina: Secondary | ICD-10-CM | POA: Diagnosis not present

## 2023-12-03 DIAGNOSIS — F332 Major depressive disorder, recurrent severe without psychotic features: Secondary | ICD-10-CM | POA: Diagnosis not present

## 2023-12-03 DIAGNOSIS — E039 Hypothyroidism, unspecified: Secondary | ICD-10-CM | POA: Diagnosis not present

## 2023-12-03 LAB — POCT WET PREP (WET MOUNT)
Clue Cells Wet Prep Whiff POC: NEGATIVE
Trichomonas Wet Prep HPF POC: ABSENT

## 2023-12-03 LAB — POCT URINALYSIS DIP (PROADVANTAGE DEVICE)
Bilirubin, UA: NEGATIVE
Blood, UA: NEGATIVE
Glucose, UA: NEGATIVE mg/dL
Ketones, POC UA: NEGATIVE mg/dL
Nitrite, UA: NEGATIVE
Specific Gravity, Urine: 1.015
Urobilinogen, Ur: NEGATIVE
pH, UA: 6.5 (ref 5.0–8.0)

## 2023-12-03 MED ORDER — NITROFURANTOIN MONOHYD MACRO 100 MG PO CAPS: 100.0000 mg | ORAL_CAPSULE | Freq: Two times a day (BID) | ORAL | 0 refills | Status: DC

## 2023-12-03 NOTE — Telephone Encounter (Signed)
 Pharmacy Patient Advocate Encounter    Please note that we have received a request for a Prior Authorization for the Rx listed below.  Insurance verification completed.   The patient is insured through [employer] test claim for Ketoconazole  2% Cream. Currently a quantity of 60g is a 15 day supply and the co-pay is 0.00 and picked up at the pts pref'd pharmacy as of 11/21/23 .   This test claim was processed through [facility]- copay amounts may vary at other pharmacies due to pharmacy/plan contracts, or as the patient moves through the different stages of their insurance plan.

## 2023-12-03 NOTE — Patient Instructions (Signed)
 Your urinalysis is abnormal today suggesting possible urinary tract infection.  Begin Macrobid  twice daily for up to a week for urinary tract infection.  We will get your culture results back in 2 to 3 days  You  also noted vaginal discharge.  Your wet prep today shows no significant infection  You had labs today.  Lauraine Hun NP your primary care provider will contact you about your lab results  I placed a referral to dermatology at your request for hair loss

## 2023-12-03 NOTE — Telephone Encounter (Signed)
 Please note that our Team has Received a Prior Authorization Request for Ketoconazole  2% [employer]     Pharmacy Patient Advocate [employer] verification completed.   The patient is insured through [employer] test claim for Ketoconazole  2% Shampoo. Currently a quantity of is a 15 day supply and the co-pay is 24.58 WITH A DISCOUNT CARD APPLIED AT THE PTS PREF'D PHARMACY .Please note that I have spoke with the pharmacy and the Rx has been filled since 6/30 and is currently read for Pickup    This test claim was processed through [facility]- copay amounts may vary at other pharmacies due to pharmacy/plan contracts, or as the patient moves through the different stages of their insurance plan.

## 2023-12-03 NOTE — Progress Notes (Signed)
 Subjective:   Sarah Espinoza is a 48 y.o. female who complains of possible urinary tract infection.   Chief Complaint  Patient presents with   Acute Visit    Bladder infection and lab work .symptoms started on and off 3 years. Now peeing on herself , left abominal pain, lower back pain.    Here for possible UTI.  Symptoms include urinary frequency, some discomfort.  Having some right lower abdominal discomfort, intermittent back pain.  Having some darker urine than usual.   Has some urine odor.   Symptoms x a few days.  Has some vaginal discharge.  In recent days had some mild urinary incontinence.    Not married.  Last sexual activity 4 months ago.   Denies STD screen, says she had this somewhere else recently  Last UTI years ago.     Has been on antibiotics for skin recently per dermatology.  Multiple antibiotic in recent months.  But doesn't think this is a yeast infection  On recent Lamisil  oral for hair loss.   They deny any recent change in soap or hygiene products.   Here also for labs from recent visit with her PCP Catheline NP here.  No other aggravating or relieving factors.  No other c/o.  Past Medical History:  Diagnosis Date   Acute non-recurrent pansinusitis 16-Sep-2021   Adjustment disorder 09-16-2021   Bipolar 1 disorder (HCC)    Body mass index (BMI) 35.0-35.9, adult 09/16/2021   Death of relative 09-16-21   Employment problem 09-16-2021   Encounter for screening for diabetes mellitus 07/04/2021   Family disruption due to divorce Sep 16, 2021   Grief Sep 16, 2021   Hypertension    Intermittent palpitations 07/04/2021   Paronychia of left index finger 07/04/2021    Current Outpatient Medications on File Prior to Visit  Medication Sig Dispense Refill   albuterol  (VENTOLIN  HFA) 108 (90 Base) MCG/ACT inhaler Inhale 2 puffs into the lungs every 4 (four) hours as needed for wheezing or shortness of breath. 18 g 0   clonazePAM  (KLONOPIN ) 0.5 MG tablet Take 1 tablet  (0.5 mg total) by mouth 2 (two) times daily as needed. 60 tablet 0   ferrous sulfate  (FEROSUL) 325 (65 FE) MG tablet TAKE 1 TABLET BY MOUTH EVERY OTHER DAY. TAKING WITH FOOD AND ORANGE JUICE CAN HELP WITH ABSORPTION. 45 tablet 1   FLUoxetine  (PROZAC ) 40 MG capsule Pharmacy reports 40mg  TID prescribed by Camie Barks.     gabapentin  (NEURONTIN ) 600 MG tablet TAKE 1 TABLET BY MOUTH THREE TIMES DAILY 270 tablet 0   levothyroxine  (SYNTHROID ) 50 MCG tablet Take 1 tablet (50 mcg total) by mouth daily before breakfast. 90 tablet 0   lisdexamfetamine (VYVANSE ) 60 MG capsule Take 60 mg by mouth daily.     lisinopril -hydrochlorothiazide  (ZESTORETIC ) 20-25 MG tablet Take 1 tablet by mouth once daily 30 tablet 0   Oxcarbazepine  (TRILEPTAL ) 300 MG tablet Take 2 tablets (600 mg total) by mouth daily. 180 tablet 3   SPRAVATO, 84 MG DOSE, 28 MG/DEVICE SOPK SMARTSIG:84 Milligram(s) Both Nares Twice a Week     terbinafine  (LAMISIL ) 250 MG tablet Take 1 tablet (250 mg total) by mouth daily. 14 tablet 0   ketoconazole  (NIZORAL ) 2 % cream Apply to infected area on neck and behind the knee twice a day. (Patient not taking: Reported on 12/03/2023) 80 g 1   ketoconazole  (NIZORAL ) 2 % shampoo Apply to scalp and skin like body wash/shampoo in shower and allow to sit for 5 minutes.  Use three days a week. (Patient not taking: Reported on 12/03/2023) 120 mL 0   ondansetron  (ZOFRAN -ODT) 4 MG disintegrating tablet Take 1 tablet (4 mg total) by mouth every 8 (eight) hours as needed. (Patient not taking: Reported on 12/03/2023) 20 tablet 0   [DISCONTINUED] propranolol (INNOPRAN XL) 120 MG 24 hr capsule Take 120 mg by mouth at bedtime.     No current facility-administered medications on file prior to visit.    ROS as in subjective  Reviewed allergies, medications, past medical, surgical, and social history.     Objective: BP 110/68   Pulse 80   Temp 98 F (36.7 C)   Wt 170 lb (77.1 kg)   BMI 27.44 kg/m   General appearance:  alert, no distress, WD/WN,white female Abdomen: +bs, soft, mild suprapubic tendnerss, othewise non tender, non distended, no masses, no hepatomegaly, no splenomegaly, no bruits Back: no CVA tenderness GU: deferred     Assessment: Encounter Diagnoses  Name Primary?   Dysuria Yes   Vaginal discharge    Hair loss    Hyperkalemia    Hypothyroidism, unspecified type    Acute low back pain without sciatica, unspecified back pain laterality    Left lower quadrant abdominal pain    Bipolar affective disorder, remission status unspecified (HCC)    Anxiety      Plan: Your urinalysis is abnormal today suggesting possible urinary tract infection.  Begin Macrobid  twice daily for up to a week for urinary tract infection.  We will get your culture results back in 2 to 3 days  You  also noted vaginal discharge.  Your wet prep today shows no significant infection  You had labs today.  Lauraine Hun NP your primary care provider will contact you about your lab results  I placed a referral to dermatology at your request for hair loss  Raini was seen today for acute visit.  Diagnoses and all orders for this visit:  Dysuria -     Urine Culture  Vaginal discharge -     POCT Wet Prep ([place] [place])  Hair loss -     Ambulatory referral to Dermatology  Hyperkalemia -     Potassium  Hypothyroidism, unspecified type -     TSH + free T4  Acute low back pain without sciatica, unspecified back pain laterality -     POCT Urinalysis DIP (Proadvantage Device)  Left lower quadrant abdominal pain -     POCT Urinalysis DIP (Proadvantage Device)  Bipolar affective disorder, remission status unspecified (HCC)  Anxiety  Other orders -     nitrofurantoin , macrocrystal-monohydrate, (MACROBID ) 100 MG capsule; Take 1 capsule (100 mg total) by mouth 2 (two) times daily.    F/u pending labs

## 2023-12-03 NOTE — Addendum Note (Signed)
 Addended by: Ilina Xu, CAMIE E on: 12/03/2023 05:19 PM   Modules accepted: Orders

## 2023-12-04 LAB — TSH+FREE T4
Free T4: 1.05 ng/dL (ref 0.82–1.77)
TSH: 0.92 u[IU]/mL (ref 0.450–4.500)

## 2023-12-04 LAB — POTASSIUM: Potassium: 3.9 mmol/L (ref 3.5–5.2)

## 2023-12-05 ENCOUNTER — Ambulatory Visit: Payer: Self-pay | Admitting: Medical

## 2023-12-05 LAB — URINE CULTURE

## 2023-12-05 NOTE — Progress Notes (Signed)
 Urine culture shows minimal bacteria.  Are her symptoms improved or not?  Any change in symptoms/new symptoms?

## 2023-12-09 DIAGNOSIS — F332 Major depressive disorder, recurrent severe without psychotic features: Secondary | ICD-10-CM | POA: Diagnosis not present

## 2023-12-13 DIAGNOSIS — F332 Major depressive disorder, recurrent severe without psychotic features: Secondary | ICD-10-CM | POA: Diagnosis not present

## 2023-12-14 ENCOUNTER — Other Ambulatory Visit: Payer: Self-pay | Admitting: Family Medicine

## 2023-12-14 DIAGNOSIS — E039 Hypothyroidism, unspecified: Secondary | ICD-10-CM

## 2023-12-14 DIAGNOSIS — I10 Essential (primary) hypertension: Secondary | ICD-10-CM

## 2023-12-17 DIAGNOSIS — F411 Generalized anxiety disorder: Secondary | ICD-10-CM | POA: Diagnosis not present

## 2023-12-17 DIAGNOSIS — F4312 Post-traumatic stress disorder, chronic: Secondary | ICD-10-CM | POA: Diagnosis not present

## 2023-12-17 DIAGNOSIS — F332 Major depressive disorder, recurrent severe without psychotic features: Secondary | ICD-10-CM | POA: Diagnosis not present

## 2023-12-17 DIAGNOSIS — F902 Attention-deficit hyperactivity disorder, combined type: Secondary | ICD-10-CM | POA: Diagnosis not present

## 2023-12-19 DIAGNOSIS — F332 Major depressive disorder, recurrent severe without psychotic features: Secondary | ICD-10-CM | POA: Diagnosis not present

## 2023-12-25 ENCOUNTER — Ambulatory Visit: Payer: Self-pay | Admitting: Nurse Practitioner

## 2023-12-25 ENCOUNTER — Telehealth: Payer: Self-pay | Admitting: Nurse Practitioner

## 2023-12-25 ENCOUNTER — Other Ambulatory Visit: Payer: Self-pay

## 2023-12-25 DIAGNOSIS — E039 Hypothyroidism, unspecified: Secondary | ICD-10-CM

## 2023-12-25 DIAGNOSIS — F332 Major depressive disorder, recurrent severe without psychotic features: Secondary | ICD-10-CM | POA: Diagnosis not present

## 2023-12-25 NOTE — Telephone Encounter (Signed)
 Pt has lab appt  8/5  can you add orders please  Please call to schedule lab only repeat potassium in 3-10 days. Also needs lab only thyroid  in 6-8 weeks.

## 2023-12-26 DIAGNOSIS — F332 Major depressive disorder, recurrent severe without psychotic features: Secondary | ICD-10-CM | POA: Diagnosis not present

## 2023-12-30 ENCOUNTER — Other Ambulatory Visit (HOSPITAL_COMMUNITY)
Admission: RE | Admit: 2023-12-30 | Discharge: 2023-12-30 | Disposition: A | Discharge status: Home / Self Care | Source: Ambulatory Visit | Attending: Obstetrics and Gynecology | Admitting: Obstetrics and Gynecology

## 2023-12-30 ENCOUNTER — Encounter: Payer: Self-pay | Admitting: Obstetrics and Gynecology

## 2023-12-30 ENCOUNTER — Ambulatory Visit: Admitting: Obstetrics and Gynecology

## 2023-12-30 VITALS — BP 117/74 | HR 83 | Ht 66.0 in | Wt 157.0 lb

## 2023-12-30 DIAGNOSIS — R102 Pelvic and perineal pain: Secondary | ICD-10-CM | POA: Diagnosis not present

## 2023-12-30 DIAGNOSIS — N941 Unspecified dyspareunia: Secondary | ICD-10-CM

## 2023-12-30 DIAGNOSIS — Z01419 Encounter for gynecological examination (general) (routine) without abnormal findings: Secondary | ICD-10-CM

## 2023-12-30 DIAGNOSIS — N898 Other specified noninflammatory disorders of vagina: Secondary | ICD-10-CM

## 2023-12-30 MED ORDER — ESTRADIOL 0.1 MG/GM VA CREA: TOPICAL_CREAM | VAGINAL | 12 refills | Status: DC

## 2023-12-30 NOTE — Progress Notes (Signed)
 ANNUAL EXAM Patient name: Sarah Espinoza MRN [id number]  Date of birth: [birth year] Chief Complaint:   Gynecologic Exam  History of Present Illness:   Sarah Espinoza is a 48 y.o. G0P0000  female being seen today for a routine annual exam.  Current complaints: she reports vaginal discomfort, vaginal dryness, and painful intercourse. This is new for her.  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 She reports she has been getting pap smears. She has been experiencing intermittent pelvic pain and desires ultrasound to evaluate   No LMP recorded. Patient is perimenopausal.    The pregnancy intention screening data noted above was reviewed. Potential methods of contraception were discussed. The patient elected to proceed with No data recorded.       10/05/2022   11:57 AM 04/05/2022   10:02 AM 08/29/2021   11:12 AM 07/04/2021   12:37 PM 01/21/2019    4:28 PM  Depression screen PHQ 2/9  Decreased Interest 0 0 0 1 3  Down, Depressed, Hopeless 2 1 0 1 3  PHQ - 2 Score 2 1 0 2 6  Altered sleeping 0 1 0 0 3  Tired, decreased energy 2 1 3 1 3   Change in appetite 1 1 0 1 1  Feeling bad or failure about yourself  2 1 0 1 2  Trouble concentrating 3 0 2 1 3   Moving slowly or fidgety/restless 2 1 2 1  0  Suicidal thoughts 0 0 0 0 0  PHQ-9 Score 12 6 7 7 18   Difficult doing work/chores Very difficult Not difficult at all Not difficult at all Somewhat difficult Very difficult        04/05/2022   10:03 AM  GAD 7 : Generalized Anxiety Score  Nervous, Anxious, on Edge 3  Control/stop worrying 3  Worry too much - different things 3  Trouble relaxing 1  Restless 1  Easily annoyed or irritable 1  Afraid - awful might happen 1  Total GAD 7 Score 13  Anxiety Difficulty Not difficult at all     Review of Systems:   Pertinent items are noted in HPI Denies any headaches, blurred vision, fatigue, shortness of breath, chest pain, abdominal pain, abnormal vaginal  discharge/itching/odor/irritation, problems with periods, bowel movements, urination, or intercourse unless otherwise stated above. Pertinent History Reviewed:  Reviewed past medical,surgical, social and family history.  Reviewed problem list, medications and allergies. Physical Assessment:   Vitals:   12/30/23 1537  BP: 117/74  Pulse: 83  Weight: 157 lb (71.2 kg)  Height: 5' 6 (1.676 m)  Body mass index is 25.34 kg/m.        Physical Examination:   General appearance - well appearing, and in no distress  Mental status - alert, oriented   Psych:  She has a normal mood and affect  Skin - warm and dry, normal color  Chest - effort normal, all lung fields clear to auscultation bilaterally  Heart - normal rate and regular rhythm  Neck:  midline trachea, no thyromegaly or nodules  Breasts - breasts appear normal, no suspicious masses, no skin or nipple changes or  axillary nodes  Abdomen - soft, nontender, nondistended  Pelvic - VULVA: normal appearing vulva with no masses, tenderness or lesions  VAGINA: normal appearing vagina with normal color and discharge, no lesions  CERVIX: normal appearing cervix without discharge or lesions, no CMT  Thin prep pap is done w HR HPV cotesting  UTERUS: uterus is felt to be normal size, shape, consistency and nontender   ADNEXA: No adnexal masses or tenderness noted.  Extremities:  No swelling or varicosities noted  Chaperone present for exam  No results found for this or any previous visit (from the past 24 hours).  Assessment & Plan:  1. Encounter for gynecological examination with Papanicolaou smear of cervix (Primary) 2. Encounter for annual routine gynecological examination Requesting records for previous pap smears Encouraged mammogram  Established with primary care  - Cytology - PAP( [facility])  3. Dyspareunia, female 4. Vaginal dryness Discussed trial of vaginal estrogen   - estradiol  (ESTRACE ) 0.1 MG/GM vaginal cream; Use 0.5g  once daily for 2 weeks. Then use 0.5 g 2 times weekly  Dispense: 42.5 g; Refill: 12  5. Pelvic pain  - US  PELVIC COMPLETE WITH TRANSVAGINAL; Future   Labs/procedures today:   Mammogram: schedule screening mammo as soon as possible, or sooner if problems  Orders Placed This Encounter  Procedures   US  PELVIC COMPLETE WITH TRANSVAGINAL    Meds:  Meds ordered this encounter  Medications   estradiol  (ESTRACE ) 0.1 MG/GM vaginal cream    Sig: Use 0.5g once daily for 2 weeks. Then use 0.5 g 2 times weekly    Dispense:  42.5 g    Refill:  12    Follow-up: Return in 1 month for follow up  Nidia Daring, FNP

## 2023-12-31 DIAGNOSIS — F3112 Bipolar disorder, current episode manic without psychotic features, moderate: Secondary | ICD-10-CM | POA: Diagnosis not present

## 2024-01-05 ENCOUNTER — Encounter: Payer: Self-pay | Admitting: Obstetrics and Gynecology

## 2024-01-06 DIAGNOSIS — F332 Major depressive disorder, recurrent severe without psychotic features: Secondary | ICD-10-CM | POA: Diagnosis not present

## 2024-01-06 DIAGNOSIS — F633 Trichotillomania: Secondary | ICD-10-CM | POA: Diagnosis not present

## 2024-01-06 DIAGNOSIS — L309 Dermatitis, unspecified: Secondary | ICD-10-CM | POA: Diagnosis not present

## 2024-01-06 DIAGNOSIS — R202 Paresthesia of skin: Secondary | ICD-10-CM | POA: Diagnosis not present

## 2024-01-07 ENCOUNTER — Other Ambulatory Visit

## 2024-01-07 LAB — CYTOLOGY - PAP
Chlamydia: NEGATIVE
Comment: NEGATIVE
Comment: NEGATIVE
Comment: NORMAL
Diagnosis: REACTIVE
High risk HPV: NEGATIVE
Neisseria Gonorrhea: NEGATIVE

## 2024-01-08 ENCOUNTER — Ambulatory Visit: Payer: Self-pay | Admitting: Obstetrics and Gynecology

## 2024-01-08 ENCOUNTER — Telehealth: Payer: Self-pay | Admitting: Obstetrics and Gynecology

## 2024-01-08 ENCOUNTER — Ambulatory Visit: Admitting: Nurse Practitioner

## 2024-01-08 ENCOUNTER — Other Ambulatory Visit: Payer: Self-pay | Admitting: Obstetrics and Gynecology

## 2024-01-08 DIAGNOSIS — N941 Unspecified dyspareunia: Secondary | ICD-10-CM

## 2024-01-08 DIAGNOSIS — N898 Other specified noninflammatory disorders of vagina: Secondary | ICD-10-CM

## 2024-01-08 DIAGNOSIS — A599 Trichomoniasis, unspecified: Secondary | ICD-10-CM | POA: Insufficient documentation

## 2024-01-08 MED ORDER — ESTRADIOL 0.1 MG/GM VA CREA: TOPICAL_CREAM | VAGINAL | 12 refills | Status: AC

## 2024-01-08 MED ORDER — METRONIDAZOLE 500 MG PO TABS
500.0000 mg | ORAL_TABLET | Freq: Two times a day (BID) | ORAL | 0 refills | Status: AC
Start: 2024-01-08 — End: 2024-01-15

## 2024-01-08 NOTE — Telephone Encounter (Signed)
 Returned patient's call. Mailbox full. Patient sent message via mychart.

## 2024-01-08 NOTE — Progress Notes (Unsigned)
 Flagyl  sent for Trich and estrace  resent

## 2024-01-08 NOTE — Telephone Encounter (Signed)
 Patient called about her lab results. Also, her estrogen wasn't sent to the [facility] in [place]. Please advise.

## 2024-01-09 ENCOUNTER — Ambulatory Visit (HOSPITAL_COMMUNITY): Admission: RE | Admit: 2024-01-09 | Source: Ambulatory Visit

## 2024-01-09 ENCOUNTER — Other Ambulatory Visit

## 2024-01-09 ENCOUNTER — Ambulatory Visit: Admitting: Medical

## 2024-01-10 ENCOUNTER — Ambulatory Visit: Admitting: Medical

## 2024-01-14 DIAGNOSIS — F4312 Post-traumatic stress disorder, chronic: Secondary | ICD-10-CM | POA: Diagnosis not present

## 2024-01-14 DIAGNOSIS — F902 Attention-deficit hyperactivity disorder, combined type: Secondary | ICD-10-CM | POA: Diagnosis not present

## 2024-01-14 DIAGNOSIS — F332 Major depressive disorder, recurrent severe without psychotic features: Secondary | ICD-10-CM | POA: Diagnosis not present

## 2024-01-14 DIAGNOSIS — F411 Generalized anxiety disorder: Secondary | ICD-10-CM | POA: Diagnosis not present

## 2024-01-15 DIAGNOSIS — F3112 Bipolar disorder, current episode manic without psychotic features, moderate: Secondary | ICD-10-CM | POA: Diagnosis not present

## 2024-01-18 ENCOUNTER — Other Ambulatory Visit: Payer: Self-pay | Admitting: Nurse Practitioner

## 2024-01-18 DIAGNOSIS — B350 Tinea barbae and tinea capitis: Secondary | ICD-10-CM

## 2024-01-21 DIAGNOSIS — F332 Major depressive disorder, recurrent severe without psychotic features: Secondary | ICD-10-CM | POA: Diagnosis not present

## 2024-01-24 ENCOUNTER — Other Ambulatory Visit: Payer: Self-pay | Admitting: Family Medicine

## 2024-01-24 DIAGNOSIS — I10 Essential (primary) hypertension: Secondary | ICD-10-CM

## 2024-01-30 DIAGNOSIS — F900 Attention-deficit hyperactivity disorder, predominantly inattentive type: Secondary | ICD-10-CM | POA: Diagnosis not present

## 2024-01-30 DIAGNOSIS — F1120 Opioid dependence, uncomplicated: Secondary | ICD-10-CM | POA: Diagnosis not present

## 2024-01-30 DIAGNOSIS — F332 Major depressive disorder, recurrent severe without psychotic features: Secondary | ICD-10-CM | POA: Diagnosis not present

## 2024-01-30 DIAGNOSIS — Z79891 Long term (current) use of opiate analgesic: Secondary | ICD-10-CM | POA: Diagnosis not present

## 2024-01-30 DIAGNOSIS — Z79899 Other long term (current) drug therapy: Secondary | ICD-10-CM | POA: Diagnosis not present

## 2024-01-30 DIAGNOSIS — F411 Generalized anxiety disorder: Secondary | ICD-10-CM | POA: Diagnosis not present

## 2024-02-04 ENCOUNTER — Other Ambulatory Visit (HOSPITAL_COMMUNITY)
Admission: RE | Admit: 2024-02-04 | Discharge: 2024-02-04 | Disposition: A | Discharge status: Home / Self Care | Source: Ambulatory Visit | Attending: Adult Health | Admitting: Adult Health

## 2024-02-04 ENCOUNTER — Encounter: Payer: Self-pay | Admitting: Adult Health

## 2024-02-04 ENCOUNTER — Ambulatory Visit: Admitting: Adult Health

## 2024-02-04 VITALS — BP 132/78 | HR 78 | Ht 66.0 in | Wt 160.0 lb

## 2024-02-04 DIAGNOSIS — Z8619 Personal history of other infectious and parasitic diseases: Secondary | ICD-10-CM | POA: Insufficient documentation

## 2024-02-04 DIAGNOSIS — A599 Trichomoniasis, unspecified: Secondary | ICD-10-CM | POA: Diagnosis not present

## 2024-02-04 DIAGNOSIS — Z09 Encounter for follow-up examination after completed treatment for conditions other than malignant neoplasm: Secondary | ICD-10-CM | POA: Insufficient documentation

## 2024-02-04 NOTE — Progress Notes (Signed)
  Subjective:     Patient ID: Sarah Espinoza, female   DOB: [birth year], 48 y.o.   MRN: [id number]  HPI Sarah Espinoza is a 48 year old white female, divorced, G0P0, in for proof of treatment for recent trich on pap.  Partner needs treating. She lost estrace  vaginal cream tube, was working well, she has moved. Partner treated for hepatitis C and now negative, she can get labs with PCP, declines today.     Component Value Date/Time   DIAGPAP - Benign reactive/reparative changes 12/30/2023 1542   HPVHIGH Negative 12/30/2023 1542   ADEQPAP  12/30/2023 1542    Satisfactory but limited for evaluation with partially obscuring   ADEQPAP  12/30/2023 1542    inflammation; no transformation zone component present.   PCP is Camie Doing NP  Review of Systems Denies any discharge Has has sex Has seen dermatologist for hair loss   Reviewed past medical,surgical, social and family history. Reviewed medications and allergies.  Objective:   Physical Exam BP 132/78 (BP Location: Right Arm, Patient Position: Sitting, Cuff Size: Normal)   Pulse 78   Ht 5' 6 (1.676 m)   Wt 160 lb (72.6 kg)   LMP 02/03/2024   BMI 25.82 kg/m     Skin warm and dry.Pelvic: external genitalia is normal in appearance no lesions, vagina: +blood, no odor,urethra has no lesions or masses noted, cervix:smooth, uterus: normal size, shape and contour, non tender, no masses felt, adnexa: no masses or tenderness noted. Bladder is non tender and no masses felt. CV swab obtained. Fall risk is low  Upstream - 02/04/24 1532       Pregnancy Intention Screening   Does the patient want to become pregnant in the next year? No    Does the patient's partner want to become pregnant in the next year? No    Would the patient like to discuss contraceptive options today? No      Contraception Wrap Up   Current Method No Method - Other Reason    End Method No Method - Other Reason         Examination chaperoned by Sarah Salt  LPN Assessment:     1. Follow-up exam after treatment (Primary) CV swab sent for trich She is aware of still +, will retreat  - Cervicovaginal ancillary only( [place])  2. History of trichomoniasis CV swab sent for trich Gave her Rx for partner Selinda Monte, dob 01/23/1980 for flagyl , 500 mg #4 take 4 po now, no alcohol  - Cervicovaginal ancillary only( [place])     Plan:    She can try to get estrace  vaginal cream refilled, may have to pay cash  Follow up prn

## 2024-02-06 ENCOUNTER — Ambulatory Visit: Payer: Self-pay | Admitting: Adult Health

## 2024-02-06 LAB — CERVICOVAGINAL ANCILLARY ONLY
Comment: NEGATIVE
Trichomonas: NEGATIVE

## 2024-02-17 ENCOUNTER — Other Ambulatory Visit: Payer: Self-pay | Admitting: Nurse Practitioner

## 2024-02-17 DIAGNOSIS — F332 Major depressive disorder, recurrent severe without psychotic features: Secondary | ICD-10-CM | POA: Diagnosis not present

## 2024-02-17 DIAGNOSIS — B350 Tinea barbae and tinea capitis: Secondary | ICD-10-CM

## 2024-02-19 DIAGNOSIS — F332 Major depressive disorder, recurrent severe without psychotic features: Secondary | ICD-10-CM | POA: Diagnosis not present

## 2024-02-21 DIAGNOSIS — F411 Generalized anxiety disorder: Secondary | ICD-10-CM | POA: Diagnosis not present

## 2024-02-21 DIAGNOSIS — F900 Attention-deficit hyperactivity disorder, predominantly inattentive type: Secondary | ICD-10-CM | POA: Diagnosis not present

## 2024-02-21 DIAGNOSIS — F3132 Bipolar disorder, current episode depressed, moderate: Secondary | ICD-10-CM | POA: Diagnosis not present

## 2024-03-09 ENCOUNTER — Encounter: Payer: Self-pay | Admitting: Nurse Practitioner

## 2024-03-09 NOTE — Telephone Encounter (Signed)
 Patient is on multiple sedating medications including gabapentin , clonazepam , Spravato, and Buprenorphine- Naloxone which significantly increases the risk of respiratory depression.   A review of the chart shows PDMP findings as follows:   Gabapentin  300mg  #90 tablets (30 day supply) filled 02/28/2024 (Script from 01/27/2024 for 600mg  tablet #30)  Clonazepam  0.5mg  #30 tablets (30 day supply) filled 03/02/2024 (Script from 01/31/2024 also for #30 as a 30 day supply)  It appears that the clonazepam  prescription was changed to 1 per day in August and the Gabapentin  was changed from 600mg  to 300mg  in September both by a new prescriber located in [place], [place], [place].  I do not have access to any of the [facility] notes to determine if the changes were part of the plan of care.     Please contact Sarah Espinoza to let her know that her medication regimen is beyond my area of familiarity and comfort. I do not have the specialization to determine if the current combination of medication dosages are safe and therefore, I cannot legally provide refills for her on these medications.  My supervising physician has also declined due to the complex nature of the medication regimen.    I recommend that she reach out to the most recent prescriber, Sarah Espinoza, to determine if the dose adjustments can be safely made to her medication regimen.

## 2024-03-11 ENCOUNTER — Ambulatory Visit: Admitting: Family Medicine

## 2024-03-11 ENCOUNTER — Telehealth: Payer: Self-pay | Admitting: Nurse Practitioner

## 2024-03-11 NOTE — Telephone Encounter (Signed)
 Thank you, Leita. If she is unable to get the medications from her psychiatrist, this is certainly the safest option for her. I cannot safely manage the medication combinations she is taking. I am not sure we have all of her medications correct in the chart at this time as they often do not come directly over from outside [facility] providers. Hopefully they can get this information at [facility] and update.

## 2024-03-11 NOTE — Telephone Encounter (Signed)
 Pt came by office for appt with Dr. Vita & we informed her that we had tried to reach her several times and that Dr. Vita couldn't not refill her meds at this time & told her Sara's recommendations..  She states she is between providers & had already gotten the last refill from her previous doctor & she didn't know what she could do until she see's the new provider.  I gave her information for [facility] on [address] who can see her now as walk in to assist with this transition period.   She states she will go now.

## 2024-03-27 ENCOUNTER — Other Ambulatory Visit: Payer: Self-pay | Admitting: Nurse Practitioner

## 2024-03-27 DIAGNOSIS — E039 Hypothyroidism, unspecified: Secondary | ICD-10-CM

## 2024-04-21 DIAGNOSIS — F332 Major depressive disorder, recurrent severe without psychotic features: Secondary | ICD-10-CM | POA: Diagnosis not present

## 2024-04-29 DIAGNOSIS — F332 Major depressive disorder, recurrent severe without psychotic features: Secondary | ICD-10-CM | POA: Diagnosis not present

## 2024-05-25 DIAGNOSIS — F900 Attention-deficit hyperactivity disorder, predominantly inattentive type: Secondary | ICD-10-CM | POA: Diagnosis not present

## 2024-05-25 DIAGNOSIS — F3132 Bipolar disorder, current episode depressed, moderate: Secondary | ICD-10-CM | POA: Diagnosis not present

## 2024-05-25 DIAGNOSIS — F411 Generalized anxiety disorder: Secondary | ICD-10-CM | POA: Diagnosis not present

## 2024-06-22 ENCOUNTER — Telehealth: Admitting: Physician Assistant

## 2024-06-22 ENCOUNTER — Encounter: Payer: Self-pay | Admitting: Physician Assistant

## 2024-06-22 DIAGNOSIS — J069 Acute upper respiratory infection, unspecified: Secondary | ICD-10-CM | POA: Diagnosis not present

## 2024-06-22 DIAGNOSIS — J4521 Mild intermittent asthma with (acute) exacerbation: Secondary | ICD-10-CM

## 2024-06-22 MED ORDER — COVID-19 FLU A+B ANTIGEN TEST VI KIT: 1.0000 | PACK | 0 refills | Status: AC | PRN

## 2024-06-22 MED ORDER — PREDNISONE 20 MG PO TABS: 40.0000 mg | ORAL_TABLET | Freq: Every day | ORAL | 0 refills | Status: AC

## 2024-06-22 MED ORDER — PROMETHAZINE-DM 6.25-15 MG/5ML PO SYRP: 5.0000 mL | ORAL_SOLUTION | Freq: Four times a day (QID) | ORAL | 0 refills | Status: AC | PRN

## 2024-06-22 MED ORDER — ALBUTEROL SULFATE HFA 108 (90 BASE) MCG/ACT IN AERS: 1.0000 | INHALATION_SPRAY | Freq: Four times a day (QID) | RESPIRATORY_TRACT | 0 refills | Status: AC | PRN

## 2024-06-22 NOTE — Patient Instructions (Signed)
 " Sarah Espinoza, thank you for joining Sarah CHRISTELLA Dickinson, PA-C for today's virtual visit.  While this provider is not your primary care provider (PCP), if your PCP is located in our provider database this encounter information will be shared with them immediately following your visit.   A [facility] MyChart account gives you access to today's visit and all your visits, tests, and labs performed at [facility]  click here if you don't have a [facility] MyChart account or go to [email or web address]  Consent: (Patient) Sarah Espinoza provided verbal consent for this virtual visit at the beginning of the encounter.  Current Medications:  Current Outpatient Medications:    albuterol  (VENTOLIN  HFA) 108 (90 Base) MCG/ACT inhaler, Inhale 1-2 puffs into the lungs every 6 (six) hours as needed., Disp: 8 g, Rfl: 0   Influenza-SARS At Home Antigen (COVID-19 FLU A+B ANTIGEN TEST) KIT, 1 Swab by In Vitro route as needed., Disp: 1 kit, Rfl: 0   predniSONE  (DELTASONE ) 20 MG tablet, Take 2 tablets (40 mg total) by mouth daily with breakfast., Disp: 10 tablet, Rfl: 0   promethazine -dextromethorphan (PROMETHAZINE -DM) 6.25-15 MG/5ML syrup, Take 5 mLs by mouth 4 (four) times daily as needed., Disp: 118 mL, Rfl: 0   buprenorphine-naloxone (SUBOXONE) 8-2 mg SUBL SL tablet, Place 0.5 tablets under the tongue 2 (two) times daily., Disp: , Rfl:    clonazePAM  (KLONOPIN ) 0.5 MG tablet, Take 1 tablet (0.5 mg total) by mouth 2 (two) times daily as needed., Disp: 60 tablet, Rfl: 0   estradiol  (ESTRACE ) 0.1 MG/GM vaginal cream, Use 0.5g once daily for 2 weeks. Then use 0.5 g 2 times weekly (Patient not taking: Reported on 02/04/2024), Disp: 42.5 g, Rfl: 12   ferrous sulfate  (FEROSUL) 325 (65 FE) MG tablet, TAKE 1 TABLET BY MOUTH EVERY OTHER DAY. TAKING WITH FOOD AND ORANGE JUICE CAN HELP WITH ABSORPTION., Disp: 45 tablet, Rfl: 1   FLUoxetine  (PROZAC ) 40 MG capsule, Pharmacy reports 40mg  TID prescribed  by Camie Barks., Disp: , Rfl:    gabapentin  (NEURONTIN ) 600 MG tablet, TAKE 1 TABLET BY MOUTH THREE TIMES DAILY, Disp: 270 tablet, Rfl: 0   ketoconazole  (NIZORAL ) 2 % shampoo, APPLY TOPICALLY TO SCALP AND SKIN LIKE BODY WASH/ SHAMPOO IN SHOWER AND ALLOW TO SIT FOR 5 MINUTES, USE THREE DAYS A WEEK., Disp: 120 mL, Rfl: 0   levothyroxine  (SYNTHROID ) 50 MCG tablet, TAKE 1 TABLET BY MOUTH ONCE DAILY BEFORE BREAKFAST, Disp: 90 tablet, Rfl: 0   lisdexamfetamine  (VYVANSE ) 60 MG capsule, Take 60 mg by mouth daily. (Patient taking differently: Take 70 mg by mouth daily.), Disp: , Rfl:    lisinopril -hydrochlorothiazide  (ZESTORETIC ) 20-25 MG tablet, Take 1 tablet by mouth once daily, Disp: 30 tablet, Rfl: 5   Oxcarbazepine  (TRILEPTAL ) 300 MG tablet, Take 2 tablets (600 mg total) by mouth daily., Disp: 180 tablet, Rfl: 3   SPRAVATO, 84 MG DOSE, 28 MG/DEVICE SOPK, SMARTSIG:84 Milligram(s) Both Nares Twice a Week, Disp: , Rfl:    Medications ordered in this encounter:  Meds ordered this encounter  Medications   promethazine -dextromethorphan (PROMETHAZINE -DM) 6.25-15 MG/5ML syrup    Sig: Take 5 mLs by mouth 4 (four) times daily as needed.    Dispense:  118 mL    Refill:  0    Supervising Provider:   BLAISE ALEENE KIDD [[id number]]   predniSONE  (DELTASONE ) 20 MG tablet    Sig: Take 2 tablets (40 mg total) by mouth daily with breakfast.    Dispense:  10 tablet  Refill:  0    Supervising Provider:   BLAISE ALEENE KIDD [id number]   albuterol  (VENTOLIN  HFA) 108 (90 Base) MCG/ACT inhaler    Sig: Inhale 1-2 puffs into the lungs every 6 (six) hours as needed.    Dispense:  8 g    Refill:  0    Supervising Provider:   BLAISE ALEENE KIDD [[id number]]   Influenza-SARS At Home Antigen (COVID-19 FLU A+B ANTIGEN TEST) KIT    Sig: 1 Swab by In Vitro route as needed.    Dispense:  1 kit    Refill:  0    Supervising Provider:   BLAISE ALEENE KIDD [id number]     *If you need refills on other medications prior to your next  appointment, please contact your pharmacy*  Follow-Up: Call back or seek an in-person evaluation if the symptoms worsen or if the condition fails to improve as anticipated.  [facility] [phone]  Other Instructions Viral Respiratory Infection A respiratory infection is an illness that affects part of the respiratory system, such as the lungs, nose, or throat. A respiratory infection that is caused by a virus is called a viral respiratory infection. Common types of viral respiratory infections include: A cold. The flu (influenza). A respiratory syncytial virus (RSV) infection. What are the causes? This condition is caused by a virus. The virus may spread through contact with droplets or direct contact with infected people or their mucus or secretions. The virus may spread from person to person (is contagious). What are the signs or symptoms? Symptoms of this condition include: A stuffy or runny nose. A sore throat or cough. Shortness of breath or difficulty breathing. Yellow or green mucus (sputum). Other symptoms may include: A fever. Sweating or chills. Fatigue. Achy muscles. A headache. How is this diagnosed? This condition may be diagnosed based on: Your symptoms. A physical exam. Testing of secretions from the nose or throat. Chest X-ray. How is this treated? This condition may be treated with medicines, such as: Antiviral medicine. This may shorten the length of time a person has symptoms. Expectorants. These make it easier to cough up mucus. Decongestant nasal sprays. Acetaminophen or NSAIDs, such as ibuprofen , to relieve fever and pain. Antibiotic medicines are not prescribed for viral infections.This is because antibiotics are designed to kill bacteria. They do not kill viruses. Follow these instructions at home: Managing pain and congestion Take over-the-counter and prescription medicines only as told by your health care provider. If you have a  sore throat, gargle with a mixture of salt and water 3-4 times a day or as needed. To make salt water, completely dissolve -1 tsp (3-6 g) of salt in 1 cup (237 mL) of warm water. Use nose drops made from salt water to ease congestion and soften raw skin around your nose. Take 2 tsp (10 mL) of honey at bedtime to lessen coughing at night. Do not give honey to children who are younger than 1 year. Drink enough fluid to keep your urine pale yellow. This helps prevent dehydration and helps loosen up mucus. General instructions  Rest as much as possible. Do not drink alcohol. Do not use any products that contain nicotine or tobacco. These products include cigarettes, chewing tobacco, and vaping devices, such as e-cigarettes. If you need help quitting, ask your health care provider. Keep all follow-up visits. This is important. How is this prevented?     Get an annual flu shot. You may get the flu shot in late  summer, fall, or winter. Ask your health care provider when you should get your flu shot. Avoid spreading your infection to other people. If you are sick: Wash your hands with soap and water often, especially after you cough or sneeze. Wash for at least 20 seconds. If soap and water are not available, use alcohol-based hand sanitizer. Cover your mouth when you cough. Cover your nose and mouth when you sneeze. Do not share cups or eating utensils. Clean commonly used objects often. Clean commonly touched surfaces. Stay home from work or school as told by your health care provider. Avoid contact with people who are sick during cold and flu season. This is generally fall and winter. Contact a health care provider if: Your symptoms last for 10 days or longer. Your symptoms get worse over time. You have severe sinus pain in your face or forehead. The glands in your jaw or neck become very swollen. You have shortness of breath. Get help right away if you: Feel pain or pressure in your  chest. Have trouble breathing. Faint or feel like you will faint. Have severe and persistent vomiting. Feel confused or disoriented. These symptoms may represent a serious problem that is an emergency. Do not wait to see if the symptoms will go away. Get medical help right away. Call your local emergency services (911 in the [place] Do not drive yourself to the hospital. Summary A respiratory infection is an illness that affects part of the respiratory system, such as the lungs, nose, or throat. A respiratory infection that is caused by a virus is called a viral respiratory infection. Common types of viral respiratory infections include a cold, influenza, and respiratory syncytial virus (RSV) infection. Symptoms of this condition include a stuffy or runny nose, cough, fatigue, achy muscles, sore throat, and fevers or chills. Antibiotic medicines are not prescribed for viral infections. This is because antibiotics are designed to kill bacteria. They are not effective against viruses. This information is not intended to replace advice given to you by your health care provider. Make sure you discuss any questions you have with your health care provider. Document Revised: 08/25/2020 Document Reviewed: 08/25/2020 Elsevier Patient Education  2024 Elsevier Inc.   If you have been instructed to have an in-person evaluation today at a local Urgent Care facility, please use the link below. It will take you to a list of all of our available [facility], including address, phone number and hours of operation. Please do not delay care.  [facility]  If you or a family member do not have a primary care provider, use the link below to schedule a visit and establish care. When you choose a [facility] primary care physician or advanced practice provider, you gain a long-term partner in health. Find a Primary Care Provider  Learn more about [facility] in-office and virtual care  options: [facility] Get Care Now "

## 2024-06-22 NOTE — Progress Notes (Signed)
 " Virtual Visit Consent   Sarah Espinoza, you are scheduled for a virtual visit with a [facility] provider today. Just as with appointments in the office, your consent must be obtained to participate. Your consent will be active for this visit and any virtual visit you may have with one of our providers in the next 365 days. If you have a MyChart account, a copy of this consent can be sent to you electronically.  As this is a virtual visit, video technology does not allow for your provider to perform a traditional examination. This may limit your provider's ability to fully assess your condition. If your provider identifies any concerns that need to be evaluated in person or the need to arrange testing (such as labs, EKG, etc.), we will make arrangements to do so. Although advances in technology are sophisticated, we cannot ensure that it will always work on either your end or our end. If the connection with a video visit is poor, the visit may have to be switched to a telephone visit. With either a video or telephone visit, we are not always able to ensure that we have a secure connection.  By engaging in this virtual visit, you consent to the provision of healthcare and authorize for your insurance to be billed (if applicable) for the services provided during this visit. Depending on your insurance coverage, you may receive a charge related to this service.  I need to obtain your verbal consent now. Are you willing to proceed with your visit today? Sarah Espinoza has provided verbal consent on 06/22/2024 for a virtual visit (video or telephone). Delon CHRISTELLA Dickinson, PA-C  Date: 06/22/2024 2:59 PM   Virtual Visit via Video Note   I, Delon CHRISTELLA Dickinson, connected with  Sarah Espinoza  ([id number], 10-Jul-1975) on 06/22/24 at  2:15 PM EST by a video-enabled telemedicine application and verified that I am speaking with the correct person using two identifiers.  Location: Patient: Virtual  Visit Location Patient: Home Provider: Virtual Visit Location Provider: Home Office   I discussed the limitations of evaluation and management by telemedicine and the availability of in person appointments. The patient expressed understanding and agreed to proceed.    History of Present Illness: Sarah Espinoza is a 49 y.o. who identifies as a female who was assigned female at birth, and is being seen today for flu-like symptoms.  HPI: URI  This is a new problem. The current episode started yesterday (Started last night). The problem has been gradually improving. Maximum temperature: subjective. The fever has been present for Less than 1 day. Associated symptoms include chest pain (chest heaviness), congestion, coughing, headaches, nausea, a plugged ear sensation, rhinorrhea (constant nose running 2 days ago that resolved), sinus pain (improving), a sore throat (mild, improving) and wheezing (audible this morning). Pertinent negatives include no ear pain. Associated symptoms comments: Chills, fatigue, body aches. She has tried inhaler use (dayquil) for the symptoms. The treatment provided mild relief.   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  Problems:  Patient Active Problem List   Diagnosis Date Noted   History of trichomoniasis 02/04/2024   Follow-up exam after treatment 02/04/2024   Trichomonas infection 01/08/2024   Tinea capitis 11/21/2023   Alopecia 11/21/2023   Folliculitis 11/21/2023   Tinea of the body 11/21/2023   Burns by, chemical 01/28/2023   Attention deficit hyperactivity disorder (ADHD), combined type 10/12/2022  Joint pain 10/12/2022   Losing weight 10/05/2022   Gastroesophageal reflux disease 08/29/2021   Generalized anxiety disorder 08/29/2021   Hyperlipidemia 08/29/2021   Moderate recurrent major depression (HCC) 08/29/2021   Other seasonal allergic rhinitis 08/29/2021   Parent/child conflict  08/29/2021   Sleep disturbance 08/29/2021   Tobacco abuse 08/29/2021   Vitamin D  deficiency 07/04/2021   Hypothyroidism 07/04/2021   Bipolar disorder (HCC) 07/04/2021   Primary hypertension 07/04/2021   Heart murmur 07/04/2021    Allergies: Allergies[1] Medications: Current Medications[2]  Observations/Objective: Patient is well-developed, well-nourished in no acute distress.  Resting comfortably at home.  Head is normocephalic, atraumatic.  No labored breathing.  Speech is clear and coherent with logical content.  Patient is alert and oriented at baseline.    Assessment and Plan: 1. Viral URI with cough (Primary) - promethazine -dextromethorphan (PROMETHAZINE -DM) 6.25-15 MG/5ML syrup; Take 5 mLs by mouth 4 (four) times daily as needed.  Dispense: 118 mL; Refill: 0 - predniSONE  (DELTASONE ) 20 MG tablet; Take 2 tablets (40 mg total) by mouth daily with breakfast.  Dispense: 10 tablet; Refill: 0 - albuterol  (VENTOLIN  HFA) 108 (90 Base) MCG/ACT inhaler; Inhale 1-2 puffs into the lungs every 6 (six) hours as needed.  Dispense: 8 g; Refill: 0 - Influenza-SARS At Home Antigen (COVID-19 FLU A+B ANTIGEN TEST) KIT; 1 Swab by In Vitro route as needed.  Dispense: 1 kit; Refill: 0  2. Mild intermittent asthmatic bronchitis with acute exacerbation - predniSONE  (DELTASONE ) 20 MG tablet; Take 2 tablets (40 mg total) by mouth daily with breakfast.  Dispense: 10 tablet; Refill: 0 - albuterol  (VENTOLIN  HFA) 108 (90 Base) MCG/ACT inhaler; Inhale 1-2 puffs into the lungs every 6 (six) hours as needed.  Dispense: 8 g; Refill: 0  - Suspect viral URI, advised to take at home Covid + Flu Test - Symptomatic medications of choice over the counter as needed - Added Promethazine  DM for cough and congestion - Added Prednisone  and Albuterol  for history of asthmatic bronchitis with increased wheezing and chest tightness - Push fluids - Rest - Seek further evaluation if symptoms change or worsen   Follow Up  Instructions: I discussed the assessment and treatment plan with the patient. The patient was provided an opportunity to ask questions and all were answered. The patient agreed with the plan and demonstrated an understanding of the instructions.  A copy of instructions were sent to the patient via MyChart unless otherwise noted below.    The patient was advised to call back or seek an in-person evaluation if the symptoms worsen or if the condition fails to improve as anticipated.    Delon CHRISTELLA Dickinson, PA-C     [1]  Allergies Allergen Reactions   Ichthammol Other (See Comments)    Burned her skin  [2]  Current Outpatient Medications:    albuterol  (VENTOLIN  HFA) 108 (90 Base) MCG/ACT inhaler, Inhale 1-2 puffs into the lungs every 6 (six) hours as needed., Disp: 8 g, Rfl: 0   Influenza-SARS At Home Antigen (COVID-19 FLU A+B ANTIGEN TEST) KIT, 1 Swab by In Vitro route as needed., Disp: 1 kit, Rfl: 0   predniSONE  (DELTASONE ) 20 MG tablet, Take 2 tablets (40 mg total) by mouth daily with breakfast., Disp: 10 tablet, Rfl: 0   promethazine -dextromethorphan (PROMETHAZINE -DM) 6.25-15 MG/5ML syrup, Take 5 mLs by mouth 4 (four) times daily as needed., Disp: 118 mL, Rfl: 0   buprenorphine-naloxone (SUBOXONE) 8-2 mg SUBL SL tablet, Place 0.5 tablets under the tongue 2 (two) times daily., Disp: ,  Rfl:    clonazePAM  (KLONOPIN ) 0.5 MG tablet, Take 1 tablet (0.5 mg total) by mouth 2 (two) times daily as needed., Disp: 60 tablet, Rfl: 0   estradiol  (ESTRACE ) 0.1 MG/GM vaginal cream, Use 0.5g once daily for 2 weeks. Then use 0.5 g 2 times weekly (Patient not taking: Reported on 02/04/2024), Disp: 42.5 g, Rfl: 12   ferrous sulfate  (FEROSUL) 325 (65 FE) MG tablet, TAKE 1 TABLET BY MOUTH EVERY OTHER DAY. TAKING WITH FOOD AND ORANGE JUICE CAN HELP WITH ABSORPTION., Disp: 45 tablet, Rfl: 1   FLUoxetine  (PROZAC ) 40 MG capsule, Pharmacy reports 40mg  TID prescribed by Camie Barks., Disp: , Rfl:    gabapentin   (NEURONTIN ) 600 MG tablet, TAKE 1 TABLET BY MOUTH THREE TIMES DAILY, Disp: 270 tablet, Rfl: 0   ketoconazole  (NIZORAL ) 2 % shampoo, APPLY TOPICALLY TO SCALP AND SKIN LIKE BODY WASH/ SHAMPOO IN SHOWER AND ALLOW TO SIT FOR 5 MINUTES, USE THREE DAYS A WEEK., Disp: 120 mL, Rfl: 0   levothyroxine  (SYNTHROID ) 50 MCG tablet, TAKE 1 TABLET BY MOUTH ONCE DAILY BEFORE BREAKFAST, Disp: 90 tablet, Rfl: 0   lisdexamfetamine  (VYVANSE ) 60 MG capsule, Take 60 mg by mouth daily. (Patient taking differently: Take 70 mg by mouth daily.), Disp: , Rfl:    lisinopril -hydrochlorothiazide  (ZESTORETIC ) 20-25 MG tablet, Take 1 tablet by mouth once daily, Disp: 30 tablet, Rfl: 5   Oxcarbazepine  (TRILEPTAL ) 300 MG tablet, Take 2 tablets (600 mg total) by mouth daily., Disp: 180 tablet, Rfl: 3   SPRAVATO, 84 MG DOSE, 28 MG/DEVICE SOPK, SMARTSIG:84 Milligram(s) Both Nares Twice a Week, Disp: , Rfl:   "

## 2024-07-03 ENCOUNTER — Other Ambulatory Visit: Payer: Self-pay

## 2024-07-03 ENCOUNTER — Ambulatory Visit
Admission: EM | Admit: 2024-07-03 | Discharge: 2024-07-03 | Disposition: A | Discharge status: Home / Self Care | Attending: Physician Assistant | Admitting: Physician Assistant

## 2024-07-03 ENCOUNTER — Telehealth: Payer: Self-pay

## 2024-07-03 ENCOUNTER — Ambulatory Visit (INDEPENDENT_AMBULATORY_CARE_PROVIDER_SITE_OTHER)

## 2024-07-03 DIAGNOSIS — R051 Acute cough: Secondary | ICD-10-CM

## 2024-07-03 DIAGNOSIS — J441 Chronic obstructive pulmonary disease with (acute) exacerbation: Secondary | ICD-10-CM

## 2024-07-03 DIAGNOSIS — R0602 Shortness of breath: Secondary | ICD-10-CM

## 2024-07-03 DIAGNOSIS — E039 Hypothyroidism, unspecified: Secondary | ICD-10-CM

## 2024-07-03 MED ORDER — METHYLPREDNISOLONE SODIUM SUCC 125 MG IJ SOLR
60.0000 mg | Freq: Once | INTRAMUSCULAR | Status: AC
  Administered 2024-07-03: 60 mg via INTRAMUSCULAR

## 2024-07-03 MED ORDER — ALBUTEROL SULFATE (2.5 MG/3ML) 0.083% IN NEBU: 2.5000 mg | INHALATION_SOLUTION | Freq: Four times a day (QID) | RESPIRATORY_TRACT | 12 refills | Status: AC | PRN

## 2024-07-03 MED ORDER — PREDNISONE 10 MG (21) PO TBPK: ORAL_TABLET | Freq: Every day | ORAL | 0 refills | Status: AC

## 2024-07-03 MED ORDER — LEVOTHYROXINE SODIUM 50 MCG PO TABS: 50.0000 ug | ORAL_TABLET | Freq: Every day | ORAL | 0 refills | Status: AC

## 2024-07-03 MED ORDER — IPRATROPIUM-ALBUTEROL 0.5-2.5 (3) MG/3ML IN SOLN
3.0000 mL | Freq: Once | RESPIRATORY_TRACT | Status: AC
  Administered 2024-07-03: 3 mL via RESPIRATORY_TRACT

## 2024-07-03 MED ORDER — AMOXICILLIN-POT CLAVULANATE 875-125 MG PO TABS: 1.0000 | ORAL_TABLET | Freq: Two times a day (BID) | ORAL | 0 refills | Status: AC

## 2024-07-03 NOTE — Telephone Encounter (Signed)
 Medication sent in and also sent pt. A MyChart message that she needs to schedule a med check or physical with Camie Doing NP.    Copied from CRM #[id number]. Topic: Clinical - Medication Refill >> Jul 03, 2024 10:55 AM Joesph NOVAK wrote: Medication: levothyroxine  (SYNTHROID ) 50 MCG tablet  Has the patient contacted their pharmacy? Yes (Agent: If no, request that the patient contact the pharmacy for the refill. If patient does not wish to contact the pharmacy document the reason why and proceed with request.) (Agent: If yes, when and what did the pharmacy advise?)  This is the patient's preferred pharmacy:   [facility] - [place], [place] - [address] [place] [address] Phone: [phone] Fax: [phone]  Is this the correct pharmacy for this prescription? Yes If no, delete pharmacy and type the correct one.   Has the prescription been filled recently? Yes  Is the patient out of the medication? Yes  Has the patient been seen for an appointment in the last year OR does the patient have an upcoming appointment? Yes  Can we respond through MyChart? Yes  Agent: Please be advised that Rx refills may take up to 3 business days. We ask that you follow-up with your pharmacy.

## 2024-07-03 NOTE — Discharge Instructions (Signed)
 We are treating you for an infection in your lungs.  Start Augmentin  twice daily for 7 days.  Start prednisone  as prescribed.  Do not take NSAIDs with this medication including aspirin, ibuprofen /Advil , naproxen/Aleve.  Use the albuterol  every 4-6 hours as needed.  If you are not feeling better in 3 to 5 days please return.  If anything worsens you need to be seen immediately.

## 2024-07-03 NOTE — ED Provider Notes (Signed)
" [facility]    CSN: [id number] Arrival date & time: 07/03/24  1903      History   Chief Complaint Chief Complaint  Patient presents with   Nasal Congestion    HPI Sarah Espinoza is a 49 y.o. female.   Patient presents today with a 5 to 6-day history of URI symptoms including congestion and cough.  She reports that over the past 24 hours she has developed significant shortness of breath with wheezing.  She completed an e-visit and was given prednisone , albuterol , Promethazine  DM.  She has been using the albuterol  regularly with only temporary improvement of symptoms.  She was unable to pick up the Promethazine  DM due to cost constraints but did take the prednisone  which provided only minimal improvement of symptoms.  She has been using over-the-counter medication for symptom management.  She denies formal diagnosis of asthma or COPD but does report she has had walking pneumonia with a similar presentation in the past.  She does smoke.  Denies any history of diabetes.  She is having difficulty with her daily activities because of symptoms.    Past Medical History:  Diagnosis Date   Acute non-recurrent pansinusitis 30-Aug-2021   Adjustment disorder 08-30-2021   Bipolar 1 disorder (HCC)    Body mass index (BMI) 35.0-35.9, adult 2021/08/30   Death of relative Aug 30, 2021   Employment problem Aug 30, 2021   Encounter for screening for diabetes mellitus 07/04/2021   Family disruption due to divorce 30-Aug-2021   Grief 08-30-2021   Hypertension    Intermittent palpitations 07/04/2021   Paronychia of left index finger 07/04/2021    Patient Active Problem List   Diagnosis Date Noted   History of trichomoniasis 02/04/2024   Follow-up exam after treatment 02/04/2024   Trichomonas infection 01/08/2024   Tinea capitis 11/21/2023   Alopecia 11/21/2023   Folliculitis 11/21/2023   Tinea of the body 11/21/2023   Burns by, chemical 01/28/2023   Attention deficit hyperactivity  disorder (ADHD), combined type 10/12/2022   Joint pain 10/12/2022   Losing weight 10/05/2022   Gastroesophageal reflux disease Aug 30, 2021   Generalized anxiety disorder 08-30-2021   Hyperlipidemia 08/30/21   Moderate recurrent major depression (HCC) 08-30-21   Other seasonal allergic rhinitis 08-30-21   Parent/child conflict 08/30/21   Sleep disturbance 08/30/21   Tobacco abuse 08/30/21   Vitamin D  deficiency 07/04/2021   Hypothyroidism 07/04/2021   Bipolar disorder (HCC) 07/04/2021   Primary hypertension 07/04/2021   Heart murmur 07/04/2021    Past Surgical History:  Procedure Laterality Date   LEEP  2006    OB History     Gravida  0   Para  0   Term  0   Preterm  0   AB  0   Living  0      SAB  0   IAB  0   Ectopic  0   Multiple  0   Live Births  0            Home Medications    Prior to Admission medications  Medication Sig Start Date End Date Taking? Authorizing Provider  albuterol  (PROVENTIL ) (2.5 MG/3ML) 0.083% nebulizer solution Take 3 mLs (2.5 mg total) by nebulization every 6 (six) hours as needed for wheezing or shortness of breath. 07/03/24  Yes Seynabou Fults K, PA-C  amoxicillin -clavulanate (AUGMENTIN ) 875-125 MG tablet Take 1 tablet by mouth every 12 (twelve) hours. 07/03/24  Yes Emory Gallentine K, PA-C  predniSONE  (STERAPRED UNI-PAK 21 TAB)  10 MG (21) TBPK tablet Take by mouth daily. Take 6 tabs by mouth daily  for 2 days, then 5 tabs for 2 days, then 4 tabs for 2 days, then 3 tabs for 2 days, 2 tabs for 2 days, then 1 tab by mouth daily for 2 days 07/03/24  Yes Costantino Kohlbeck K, PA-C  albuterol  (VENTOLIN  HFA) 108 (90 Base) MCG/ACT inhaler Inhale 1-2 puffs into the lungs every 6 (six) hours as needed. 06/22/24   Vivienne Delon HERO, PA-C  buprenorphine-naloxone (SUBOXONE) 8-2 mg SUBL SL tablet Place 0.5 tablets under the tongue 2 (two) times daily. 01/30/24   [provider]  clonazePAM  (KLONOPIN ) 0.5 MG tablet Take 1 tablet  (0.5 mg total) by mouth 2 (two) times daily as needed. 01/22/23   Early, Sara E, NP  estradiol  (ESTRACE ) 0.1 MG/GM vaginal cream Use 0.5g once daily for 2 weeks. Then use 0.5 g 2 times weekly Patient not taking: Reported on 02/04/2024 01/08/24   Delores Nidia CROME, FNP  ferrous sulfate  (FEROSUL) 325 (65 FE) MG tablet TAKE 1 TABLET BY MOUTH EVERY OTHER DAY. TAKING WITH FOOD AND ORANGE JUICE CAN HELP WITH ABSORPTION. 04/02/23   Early, Sara E, NP  FLUoxetine  (PROZAC ) 40 MG capsule Pharmacy reports 40mg  TID prescribed by Camie Barks. 07/31/23   Early, Sara E, NP  gabapentin  (NEURONTIN ) 600 MG tablet TAKE 1 TABLET BY MOUTH THREE TIMES DAILY 03/05/23   Early, Sara E, NP  Influenza-SARS At Home Antigen (COVID-19 FLU A+B ANTIGEN TEST) KIT 1 Swab by In Vitro route as needed. 06/22/24   Vivienne Delon HERO, PA-C  ketoconazole  (NIZORAL ) 2 % shampoo APPLY TOPICALLY TO SCALP AND SKIN LIKE BODY WASH/ SHAMPOO IN SHOWER AND ALLOW TO SIT FOR 5 MINUTES, USE THREE DAYS A WEEK. 02/17/24   Early, Sara E, NP  levothyroxine  (SYNTHROID ) 50 MCG tablet Take 1 tablet (50 mcg total) by mouth daily before breakfast. 07/03/24   Early, Sara E, NP  lisdexamfetamine  (VYVANSE ) 60 MG capsule Take 60 mg by mouth daily. Patient taking differently: Take 70 mg by mouth daily. 11/16/23   [provider]  lisinopril -hydrochlorothiazide  (ZESTORETIC ) 20-25 MG tablet Take 1 tablet by mouth once daily 01/24/24   Early, Sara E, NP  Oxcarbazepine  (TRILEPTAL ) 300 MG tablet Take 2 tablets (600 mg total) by mouth daily. 04/05/22   Early, Sara E, NP  predniSONE  (DELTASONE ) 20 MG tablet Take 2 tablets (40 mg total) by mouth daily with breakfast. 06/22/24   Vivienne Delon HERO, PA-C  promethazine -dextromethorphan (PROMETHAZINE -DM) 6.25-15 MG/5ML syrup Take 5 mLs by mouth 4 (four) times daily as needed. 06/22/24   Vivienne Delon HERO, PA-C  SPRAVATO, 84 MG DOSE, 28 MG/DEVICE SOPK SMARTSIG:84 Milligram(s) Both Nares Twice a Week 11/11/23   [provider]  propranolol (INNOPRAN XL) 120 MG 24 hr capsule Take 120 mg by mouth at bedtime.  11/24/18  [provider]    Family History Family History  Problem Relation Age of Onset   Hypertension Father    Cancer Mother    Hypertension Mother     Social History Social History[1]   Allergies   Ichthammol   Review of Systems Review of Systems  Constitutional:  Positive for activity change. Negative for appetite change, fatigue and fever.  HENT:  Positive for congestion. Negative for sinus pressure, sneezing and sore throat.   Respiratory:  Positive for cough and shortness of breath.   Cardiovascular:  Negative for chest pain.  Gastrointestinal:  Negative for abdominal pain, diarrhea,  nausea and vomiting.     Physical Exam Triage Vital Signs ED Triage Vitals  Encounter Vitals Group     BP 07/03/24 1915 121/75     Girls Systolic BP Percentile --      Girls Diastolic BP Percentile --      Boys Systolic BP Percentile --      Boys Diastolic BP Percentile --      Pulse Rate 07/03/24 1915 93     Resp 07/03/24 1915 16     Temp 07/03/24 1915 98 F (36.7 C)     Temp Source 07/03/24 1915 Oral     SpO2 07/03/24 1915 97 %     Weight --      Height --      Head Circumference --      Peak Flow --      Pain Score 07/03/24 1912 0     Pain Loc --      Pain Education --      Exclude from Growth Chart --    No data found.  Updated Vital Signs BP 121/75 (BP Location: Right Arm)   Pulse 93   Temp 98 F (36.7 C) (Oral)   Resp 16   SpO2 97%   Visual Acuity Right Eye Distance:   Left Eye Distance:   Bilateral Distance:    Right Eye Near:   Left Eye Near:    Bilateral Near:     Physical Exam Vitals reviewed.  Constitutional:      General: She is awake. She is not in acute distress.    Appearance: Normal appearance. She is well-developed. She is not ill-appearing.     Comments: Very pleasant female appears stated age in no acute distress sitting comfortably in exam  room  HENT:     Head: Normocephalic and atraumatic.     Right Ear: Tympanic membrane, ear canal and external ear normal. Tympanic membrane is not erythematous or bulging.     Left Ear: Tympanic membrane, ear canal and external ear normal. Tympanic membrane is not erythematous or bulging.     Nose:     Right Sinus: No maxillary sinus tenderness or frontal sinus tenderness.     Left Sinus: No maxillary sinus tenderness or frontal sinus tenderness.     Mouth/Throat:     Pharynx: Uvula midline. No oropharyngeal exudate or posterior oropharyngeal erythema.  Cardiovascular:     Rate and Rhythm: Normal rate and regular rhythm.     Heart sounds: Normal heart sounds, S1 normal and S2 normal. No murmur heard. Pulmonary:     Effort: Pulmonary effort is normal.     Breath sounds: Wheezing present. No rhonchi or rales.     Comments: Widespread wheezing; significant improvement of wheezing following DuoNeb in clinic Psychiatric:        Behavior: Behavior is cooperative.      UC Treatments / Results  Labs (all labs ordered are listed, but only abnormal results are displayed) Labs Reviewed - No data to display  EKG   Radiology No results found.  Procedures Procedures (including critical care time)  Medications Ordered in UC Medications  ipratropium-albuterol  (DUONEB) 0.5-2.5 (3) MG/3ML nebulizer solution 3 mL (3 mLs Nebulization Given 07/03/24 1931)  methylPREDNISolone  sodium succinate (SOLU-MEDROL ) 125 mg/2 mL injection 60 mg (60 mg Intramuscular Given 07/03/24 1932)    Initial Impression / Assessment and Plan / UC Course  I have reviewed the triage vital signs and the nursing notes.  Pertinent labs & imaging  results that were available during my care of the patient were reviewed by me and considered in my medical decision making (see chart for details).     Patient is well-appearing, afebrile, nontoxic, nontachycardic.  Viral testing was deferred as she had negative COVID and flu  test at home and is outside the window where antivirals would be helpful so we will defer testing.  Chest x-ray was obtained that showed no acute cardiopulmonary disease based on my primary read.  We were waiting for radiologist overread at the time of discharge and will contact her if this differs and changes her treatment plan.  I believe she has underlying COPD that is currently exacerbated by acute illness.  Given her history of tobacco use with ongoing shortness of breath, wheezing, increased butyrin production and cough we will cover for COPD exacerbation with Augmentin  twice daily for 7 days.  She was also started on prednisone  taper and we discussed that she is not to take NSAIDs with this medication due to risk of GI bleeding.  She has not been able to coordinate using an inhaler and so was given a nebulizer in clinic and albuterol  nebulizer solution was sent to pharmacy.  We discussed she can use this every 4-6 hours as needed for shortness of breath and coughing fits.  No indication for dose adjustment based on metabolic panel from 11/21/2023 with creatinine of 0.56 and calculated creatinine clearance of 144 mL/min.  We discussed that if she is not feeling better within a few days of starting all of these medications she should return for reevaluation.  If anything worsens she needs to go to the ER.  Strict return precautions given.    Final Clinical Impressions(s) / UC Diagnoses   Final diagnoses:  Acute cough  Shortness of breath  COPD exacerbation [facility])     Discharge Instructions      We are treating you for an infection in your lungs.  Start Augmentin  twice daily for 7 days.  Start prednisone  as prescribed.  Do not take NSAIDs with this medication including aspirin, ibuprofen /Advil , naproxen/Aleve.  Use the albuterol  every 4-6 hours as needed.  If you are not feeling better in 3 to 5 days please return.  If anything worsens you need to be seen immediately.     ED Prescriptions      Medication Sig Dispense Auth. Provider   albuterol  (PROVENTIL ) (2.5 MG/3ML) 0.083% nebulizer solution Take 3 mLs (2.5 mg total) by nebulization every 6 (six) hours as needed for wheezing or shortness of breath. 75 mL Mireyah Chervenak K, PA-C   predniSONE  (STERAPRED UNI-PAK 21 TAB) 10 MG (21) TBPK tablet Take by mouth daily. Take 6 tabs by mouth daily  for 2 days, then 5 tabs for 2 days, then 4 tabs for 2 days, then 3 tabs for 2 days, 2 tabs for 2 days, then 1 tab by mouth daily for 2 days 42 tablet Cullen Vanallen K, PA-C   amoxicillin -clavulanate (AUGMENTIN ) 875-125 MG tablet Take 1 tablet by mouth every 12 (twelve) hours. 14 tablet Janalee Grobe K, PA-C      PDMP not reviewed this encounter.    [1]  Social History Tobacco Use   Smoking status: Every Day    Current packs/day: 1.00    Types: Cigarettes   Smokeless tobacco: Never  Vaping Use   Vaping status: Never Used  Substance Use Topics   Alcohol use: No   Drug use: No     Xayvier Vallez K, PA-C  07/03/24 1957 ° °"

## 2024-07-03 NOTE — ED Triage Notes (Addendum)
 Pt states cough and congestion,chills,sob and wheezing for the past 4 days.  States she took a home covid flu test yesterday that was negative and she just finished a course of prednisone  that she got from a tele visit.

## 2024-07-04 ENCOUNTER — Ambulatory Visit: Payer: Self-pay | Admitting: Physician Assistant
